# Patient Record
Sex: Female | Born: 1935 | Race: White | Hispanic: No | Smoking: Never smoker
Health system: Southern US, Community
[De-identification: ages and names within clinical notes are randomized; demographics above are authoritative.]

## PROBLEM LIST (undated history)

## (undated) DIAGNOSIS — E785 Hyperlipidemia, unspecified: Secondary | ICD-10-CM

## (undated) DIAGNOSIS — C801 Malignant (primary) neoplasm, unspecified: Secondary | ICD-10-CM

## (undated) DIAGNOSIS — K219 Gastro-esophageal reflux disease without esophagitis: Secondary | ICD-10-CM

## (undated) DIAGNOSIS — I5181 Takotsubo syndrome: Secondary | ICD-10-CM

## (undated) DIAGNOSIS — M1990 Unspecified osteoarthritis, unspecified site: Secondary | ICD-10-CM

## (undated) DIAGNOSIS — E119 Type 2 diabetes mellitus without complications: Secondary | ICD-10-CM

## (undated) DIAGNOSIS — E079 Disorder of thyroid, unspecified: Secondary | ICD-10-CM

## (undated) DIAGNOSIS — I10 Essential (primary) hypertension: Secondary | ICD-10-CM

## (undated) DIAGNOSIS — I219 Acute myocardial infarction, unspecified: Secondary | ICD-10-CM

## (undated) HISTORY — DX: Hyperlipidemia, unspecified: E78.5

## (undated) HISTORY — PX: APPENDECTOMY: SHX54

## (undated) HISTORY — DX: Unspecified osteoarthritis, unspecified site: M19.90

## (undated) HISTORY — PX: THYROID SURGERY: SHX805

## (undated) HISTORY — PX: MASTECTOMY: SHX3

## (undated) HISTORY — DX: Gastro-esophageal reflux disease without esophagitis: K21.9

## (undated) HISTORY — PX: BREAST SURGERY: SHX581

---

## 1998-04-03 ENCOUNTER — Other Ambulatory Visit: Admission: RE | Admit: 1998-04-03 | Discharge: 1998-04-03 | Payer: Self-pay | Admitting: Internal Medicine

## 1999-01-07 ENCOUNTER — Ambulatory Visit (HOSPITAL_COMMUNITY): Admission: RE | Admit: 1999-01-07 | Discharge: 1999-01-07 | Payer: Self-pay | Admitting: Internal Medicine

## 1999-03-27 ENCOUNTER — Other Ambulatory Visit: Admission: RE | Admit: 1999-03-27 | Discharge: 1999-03-27 | Payer: Self-pay | Admitting: *Deleted

## 2000-06-09 ENCOUNTER — Other Ambulatory Visit: Admission: RE | Admit: 2000-06-09 | Discharge: 2000-06-09 | Payer: Self-pay | Admitting: Internal Medicine

## 2000-06-14 ENCOUNTER — Encounter: Payer: Self-pay | Admitting: Internal Medicine

## 2000-06-14 ENCOUNTER — Encounter: Admission: RE | Admit: 2000-06-14 | Discharge: 2000-06-14 | Payer: Self-pay | Admitting: Internal Medicine

## 2000-07-06 ENCOUNTER — Encounter: Payer: Self-pay | Admitting: Physical Medicine and Rehabilitation

## 2000-07-06 ENCOUNTER — Ambulatory Visit
Admission: RE | Admit: 2000-07-06 | Discharge: 2000-07-06 | Payer: Self-pay | Admitting: Physical Medicine and Rehabilitation

## 2000-08-04 ENCOUNTER — Encounter
Admission: RE | Admit: 2000-08-04 | Discharge: 2000-10-05 | Payer: Self-pay | Admitting: Physical Medicine and Rehabilitation

## 2000-12-21 HISTORY — PX: CARDIAC CATHETERIZATION: SHX172

## 2001-06-28 ENCOUNTER — Encounter: Payer: Self-pay | Admitting: Cardiology

## 2001-06-28 ENCOUNTER — Encounter: Admission: RE | Admit: 2001-06-28 | Discharge: 2001-06-28 | Payer: Self-pay | Admitting: Cardiology

## 2001-07-04 ENCOUNTER — Ambulatory Visit (HOSPITAL_COMMUNITY): Admission: RE | Admit: 2001-07-04 | Discharge: 2001-07-04 | Payer: Self-pay | Admitting: Cardiology

## 2002-05-01 ENCOUNTER — Other Ambulatory Visit: Admission: RE | Admit: 2002-05-01 | Discharge: 2002-05-01 | Payer: Self-pay | Admitting: Internal Medicine

## 2002-08-01 ENCOUNTER — Encounter: Payer: Self-pay | Admitting: Orthopedic Surgery

## 2002-08-02 ENCOUNTER — Ambulatory Visit (HOSPITAL_COMMUNITY): Admission: RE | Admit: 2002-08-02 | Discharge: 2002-08-03 | Payer: Self-pay | Admitting: Orthopedic Surgery

## 2002-08-02 ENCOUNTER — Encounter: Payer: Self-pay | Admitting: Orthopedic Surgery

## 2002-08-09 ENCOUNTER — Ambulatory Visit: Admission: RE | Admit: 2002-08-09 | Discharge: 2002-08-09 | Payer: Self-pay | Admitting: Orthopedic Surgery

## 2002-12-01 ENCOUNTER — Encounter: Payer: Self-pay | Admitting: Cardiology

## 2002-12-01 ENCOUNTER — Encounter: Admission: RE | Admit: 2002-12-01 | Discharge: 2002-12-01 | Payer: Self-pay | Admitting: Cardiology

## 2002-12-08 ENCOUNTER — Ambulatory Visit (HOSPITAL_BASED_OUTPATIENT_CLINIC_OR_DEPARTMENT_OTHER): Admission: RE | Admit: 2002-12-08 | Discharge: 2002-12-08 | Payer: Self-pay | Admitting: Orthopedic Surgery

## 2003-10-05 ENCOUNTER — Encounter: Admission: RE | Admit: 2003-10-05 | Discharge: 2003-10-05 | Payer: Self-pay | Admitting: Gastroenterology

## 2003-10-05 ENCOUNTER — Encounter: Payer: Self-pay | Admitting: Gastroenterology

## 2004-05-30 ENCOUNTER — Encounter: Admission: RE | Admit: 2004-05-30 | Discharge: 2004-08-28 | Payer: Self-pay | Admitting: Internal Medicine

## 2004-09-18 ENCOUNTER — Encounter: Admission: RE | Admit: 2004-09-18 | Discharge: 2004-12-17 | Payer: Self-pay | Admitting: Internal Medicine

## 2005-06-26 ENCOUNTER — Ambulatory Visit (HOSPITAL_COMMUNITY): Admission: RE | Admit: 2005-06-26 | Discharge: 2005-06-26 | Payer: Self-pay | Admitting: Internal Medicine

## 2005-08-26 ENCOUNTER — Other Ambulatory Visit: Admission: RE | Admit: 2005-08-26 | Discharge: 2005-08-26 | Payer: Self-pay | Admitting: Internal Medicine

## 2007-09-14 ENCOUNTER — Encounter: Admission: RE | Admit: 2007-09-14 | Discharge: 2007-09-14 | Payer: Self-pay | Admitting: *Deleted

## 2008-12-21 DIAGNOSIS — I5181 Takotsubo syndrome: Secondary | ICD-10-CM

## 2008-12-21 DIAGNOSIS — I219 Acute myocardial infarction, unspecified: Secondary | ICD-10-CM

## 2008-12-21 HISTORY — DX: Acute myocardial infarction, unspecified: I21.9

## 2008-12-21 HISTORY — DX: Takotsubo syndrome: I51.81

## 2009-01-06 ENCOUNTER — Inpatient Hospital Stay (HOSPITAL_COMMUNITY): Admission: EM | Admit: 2009-01-06 | Discharge: 2009-01-10 | Payer: Self-pay | Admitting: Emergency Medicine

## 2009-01-06 ENCOUNTER — Ambulatory Visit: Payer: Self-pay | Admitting: *Deleted

## 2010-05-30 ENCOUNTER — Inpatient Hospital Stay (HOSPITAL_COMMUNITY): Admission: EM | Admit: 2010-05-30 | Discharge: 2010-06-02 | Payer: Self-pay | Admitting: Emergency Medicine

## 2010-12-08 ENCOUNTER — Encounter
Admission: RE | Admit: 2010-12-08 | Discharge: 2010-12-08 | Payer: Self-pay | Source: Home / Self Care | Attending: Cardiology | Admitting: Cardiology

## 2011-01-06 ENCOUNTER — Ambulatory Visit (HOSPITAL_COMMUNITY)
Admission: RE | Admit: 2011-01-06 | Discharge: 2011-01-06 | Payer: Self-pay | Source: Home / Self Care | Attending: Internal Medicine | Admitting: Internal Medicine

## 2011-03-09 LAB — BASIC METABOLIC PANEL
BUN: 19 mg/dL (ref 6–23)
CO2: 31 mEq/L (ref 19–32)
Calcium: 8.8 mg/dL (ref 8.4–10.5)
Calcium: 8.8 mg/dL (ref 8.4–10.5)
Creatinine, Ser: 1.15 mg/dL (ref 0.4–1.2)
GFR calc Af Amer: 55 mL/min — ABNORMAL LOW (ref >60)
GFR calc non Af Amer: >60 mL/min (ref >60)
Glucose, Bld: 106 mg/dL — ABNORMAL HIGH (ref 70–99)

## 2011-03-09 LAB — CBC
Hemoglobin: 12.2 g/dL (ref 12.0–15.0)
MCHC: 33.7 g/dL (ref 30.0–36.0)
MCHC: 33.9 g/dL (ref 30.0–36.0)
Platelets: 161 10*3/uL (ref 150–400)
RBC: 3.9 MIL/uL (ref 3.87–5.11)
RDW: 13.3 % (ref 11.5–15.5)
WBC: 16.3 10*3/uL — ABNORMAL HIGH (ref 4.0–10.5)

## 2011-03-09 LAB — CARDIAC PANEL(CRET KIN+CKTOT+MB+TROPI)
CK, MB: 3.2 ng/mL (ref 0.3–4.0)
CK, MB: 3.6 ng/mL (ref 0.3–4.0)
Relative Index: 3 — ABNORMAL HIGH (ref 0.0–2.5)
Relative Index: INVALID (ref 0.0–2.5)
Total CK: 94 U/L (ref 7–177)
Troponin I: 0.02 ng/mL (ref 0.00–0.06)
Troponin I: 0.02 ng/mL (ref 0.00–0.06)

## 2011-03-09 LAB — COMPREHENSIVE METABOLIC PANEL
Albumin: 3.3 g/dL — ABNORMAL LOW (ref 3.5–5.2)
CO2: 28 mEq/L (ref 19–32)
Chloride: 99 mEq/L (ref 96–112)
Creatinine, Ser: 0.9 mg/dL (ref 0.4–1.2)
GFR calc non Af Amer: 60 mL/min — ABNORMAL LOW (ref >60)
Sodium: 135 mEq/L (ref 135–145)
Total Bilirubin: 0.6 mg/dL (ref 0.3–1.2)
Total Protein: 6.4 g/dL (ref 6.0–8.3)

## 2011-03-09 LAB — URINALYSIS, ROUTINE W REFLEX MICROSCOPIC
Bilirubin Urine: NEGATIVE
Glucose, UA: 250 mg/dL — AB
Hgb urine dipstick: NEGATIVE
Urobilinogen, UA: 0.2 mg/dL (ref 0.0–1.0)

## 2011-03-09 LAB — GLUCOSE, CAPILLARY
Glucose-Capillary: 107 mg/dL — ABNORMAL HIGH (ref 70–99)
Glucose-Capillary: 126 mg/dL — ABNORMAL HIGH (ref 70–99)
Glucose-Capillary: 136 mg/dL — ABNORMAL HIGH (ref 70–99)
Glucose-Capillary: 142 mg/dL — ABNORMAL HIGH (ref 70–99)
Glucose-Capillary: 144 mg/dL — ABNORMAL HIGH (ref 70–99)
Glucose-Capillary: 91 mg/dL (ref 70–99)

## 2011-03-09 LAB — DIFFERENTIAL
Eosinophils Absolute: 0 10*3/uL (ref 0.0–0.7)
Eosinophils Relative: 0 % (ref 0–5)
Lymphocytes Relative: 20 % (ref 12–46)
Monocytes Absolute: 1.4 10*3/uL — ABNORMAL HIGH (ref 0.1–1.0)
Monocytes Relative: 9 % (ref 3–12)
Neutrophils Relative %: 71 % (ref 43–77)

## 2011-03-09 LAB — POCT CARDIAC MARKERS: CKMB, poc: 2 ng/mL (ref 1.0–8.0)

## 2011-03-09 LAB — TSH: TSH: 1.411 u[IU]/mL (ref 0.350–4.500)

## 2011-04-06 LAB — GLUCOSE, CAPILLARY
Glucose-Capillary: 113 mg/dL — ABNORMAL HIGH (ref 70–99)
Glucose-Capillary: 113 mg/dL — ABNORMAL HIGH (ref 70–99)
Glucose-Capillary: 118 mg/dL — ABNORMAL HIGH (ref 70–99)
Glucose-Capillary: 122 mg/dL — ABNORMAL HIGH (ref 70–99)
Glucose-Capillary: 124 mg/dL — ABNORMAL HIGH (ref 70–99)
Glucose-Capillary: 127 mg/dL — ABNORMAL HIGH (ref 70–99)
Glucose-Capillary: 170 mg/dL — ABNORMAL HIGH (ref 70–99)
Glucose-Capillary: 216 mg/dL — ABNORMAL HIGH (ref 70–99)
Glucose-Capillary: 222 mg/dL — ABNORMAL HIGH (ref 70–99)
Glucose-Capillary: 246 mg/dL — ABNORMAL HIGH (ref 70–99)
Glucose-Capillary: 82 mg/dL (ref 70–99)

## 2011-04-06 LAB — BASIC METABOLIC PANEL
BUN: 12 mg/dL (ref 6–23)
Chloride: 107 mEq/L (ref 96–112)
GFR calc Af Amer: >60 mL/min (ref >60)
GFR calc non Af Amer: >60 mL/min (ref >60)
GFR calc non Af Amer: >60 mL/min (ref >60)
Glucose, Bld: 141 mg/dL — ABNORMAL HIGH (ref 70–99)
Potassium: 3.5 mEq/L (ref 3.5–5.1)
Potassium: 3.7 mEq/L (ref 3.5–5.1)
Sodium: 138 mEq/L (ref 135–145)
Sodium: 139 mEq/L (ref 135–145)

## 2011-04-06 LAB — CULTURE, BLOOD (ROUTINE X 2)

## 2011-04-06 LAB — TSH: TSH: 0.986 u[IU]/mL (ref 0.350–4.500)

## 2011-04-06 LAB — SEDIMENTATION RATE: Sed Rate: 14 mm/hr (ref 0–22)

## 2011-04-06 LAB — CBC
HCT: 32.9 % — ABNORMAL LOW (ref 36.0–46.0)
Hemoglobin: 11.2 g/dL — ABNORMAL LOW (ref 12.0–15.0)
MCHC: 34.3 g/dL (ref 30.0–36.0)
MCV: 93.1 fL (ref 78.0–100.0)
MCV: 93.7 fL (ref 78.0–100.0)
Platelets: 154 10*3/uL (ref 150–400)
Platelets: 176 10*3/uL (ref 150–400)
Platelets: 201 10*3/uL (ref 150–400)
RBC: 3.49 MIL/uL — ABNORMAL LOW (ref 3.87–5.11)
RBC: 3.55 MIL/uL — ABNORMAL LOW (ref 3.87–5.11)
RBC: 3.71 MIL/uL — ABNORMAL LOW (ref 3.87–5.11)
WBC: 10.2 10*3/uL (ref 4.0–10.5)
WBC: 10.4 10*3/uL (ref 4.0–10.5)
WBC: 11.6 10*3/uL — ABNORMAL HIGH (ref 4.0–10.5)

## 2011-04-06 LAB — COMPREHENSIVE METABOLIC PANEL
ALT: 9 U/L (ref 0–35)
AST: 19 U/L (ref 0–37)
Albumin: 3.1 g/dL — ABNORMAL LOW (ref 3.5–5.2)
Alkaline Phosphatase: 29 U/L — ABNORMAL LOW (ref 39–117)
CO2: 27 mEq/L (ref 19–32)
Chloride: 104 mEq/L (ref 96–112)
GFR calc Af Amer: >60 mL/min (ref >60)
GFR calc non Af Amer: >60 mL/min (ref >60)
Potassium: 3 mEq/L — ABNORMAL LOW (ref 3.5–5.1)
Sodium: 137 mEq/L (ref 135–145)
Total Bilirubin: 0.7 mg/dL (ref 0.3–1.2)

## 2011-04-06 LAB — LIPID PANEL
Total CHOL/HDL Ratio: 5.3 RATIO
VLDL: 23 mg/dL (ref 0–40)

## 2011-04-06 LAB — DIFFERENTIAL
Basophils Absolute: 0 10*3/uL (ref 0.0–0.1)
Eosinophils Absolute: 0 10*3/uL (ref 0.0–0.7)
Lymphs Abs: 1.2 10*3/uL (ref 0.7–4.0)
Neutrophils Relative %: 88 % — ABNORMAL HIGH (ref 43–77)

## 2011-04-06 LAB — URINE CULTURE

## 2011-04-06 LAB — CARDIAC PANEL(CRET KIN+CKTOT+MB+TROPI)
CK, MB: 6.7 ng/mL — ABNORMAL HIGH (ref 0.3–4.0)
CK, MB: 6.9 ng/mL — ABNORMAL HIGH (ref 0.3–4.0)
CK, MB: 8.3 ng/mL — ABNORMAL HIGH (ref 0.3–4.0)
Relative Index: 5.4 — ABNORMAL HIGH (ref 0.0–2.5)
Relative Index: 5.6 — ABNORMAL HIGH (ref 0.0–2.5)
Total CK: 146 U/L (ref 7–177)
Total CK: 82 U/L (ref 7–177)
Troponin I: 1.2 ng/mL (ref 0.00–0.06)
Troponin I: 1.42 ng/mL (ref 0.00–0.06)
Troponin I: 2.04 ng/mL (ref 0.00–0.06)

## 2011-04-06 LAB — HEPARIN LEVEL (UNFRACTIONATED)
Heparin Unfractionated: 0.35 IU/mL (ref 0.30–0.70)
Heparin Unfractionated: 0.77 IU/mL — ABNORMAL HIGH (ref 0.30–0.70)
Heparin Unfractionated: <0.1 IU/mL — ABNORMAL LOW (ref 0.30–0.70)

## 2011-04-06 LAB — POCT I-STAT, CHEM 8
Creatinine, Ser: 0.8 mg/dL (ref 0.4–1.2)
HCT: 42 % (ref 36.0–46.0)
Hemoglobin: 14.3 g/dL (ref 12.0–15.0)
Potassium: 3.6 mEq/L (ref 3.5–5.1)
Sodium: 133 mEq/L — ABNORMAL LOW (ref 135–145)

## 2011-04-06 LAB — URINALYSIS, ROUTINE W REFLEX MICROSCOPIC
Bilirubin Urine: NEGATIVE
Ketones, ur: 15 mg/dL — AB
Nitrite: NEGATIVE
Urobilinogen, UA: 0.2 mg/dL (ref 0.0–1.0)

## 2011-04-06 LAB — MAGNESIUM: Magnesium: 2.6 mg/dL — ABNORMAL HIGH (ref 1.5–2.5)

## 2011-04-06 LAB — APTT: aPTT: 39 seconds — ABNORMAL HIGH (ref 24–37)

## 2011-04-06 LAB — PROTIME-INR: INR: 1 (ref 0.00–1.49)

## 2011-05-05 NOTE — Discharge Summary (Signed)
Lori Schwartz, Lori Schwartz                ACCOUNT NO.:  [id number]   MEDICAL RECORD NO.:  [id number]          PATIENT TYPE:  INP   LOCATION:  1421                         FACILITY:  [facility]   PHYSICIAN:  Hollice Espy, M.D.DATE OF BIRTH:  1925-07-24   DATE OF ADMISSION:  01/06/2009  DATE OF DISCHARGE:  01/10/2009                               DISCHARGE SUMMARY   PRIMARY CARE PHYSICIAN:  Hal T. Stoneking, M.D.   CARDIOLOGIST:  Thereasa Solo. Little, M.D., [facility].   DISCHARGE DIAGNOSES:  1. Non-Q-wave myocardial infarction.  2. Systolic congestive heart failure decreased ejection fraction of      40%.  No acute exacerbation.  3. CAD, mild.  4. Suspected coronary artery spasm.  5. Hypertension.  6. Fall.  7. Sciatica, now resolved.   DISCHARGE MEDICATIONS:  1. New medicines for this patient:  The patient's Mavik is being      increased from 5 mg up to 8 mg.  2. Skelaxin 100 p.o. t.i.d. p.r.n. for sciatic pain.  3. The patient will continue all her other medications.  These are as      follows:      a.     Neurontin 300 mg p.o. q.h.s.      b.     HCTZ 12.5 p.o. daily.      c.     Metformin 100 p.o. b.i.d.      d.     Metoprolol 50 mg p.o. b.i.d.      e.     Prilosec 20 p.o. daily.      f.     Synthroid 112 mcg p.o. daily.      g.     Mavik being increased from 5 to 8 mg.      h.     Welchol 625 p.o. daily.   DISCHARGE INSTRUCTIONS:  Diet:  Heart-healthy diet.   DISPOSITION:  Improved.   HOSPITAL COURSE:  The patient is an 77 year old white female past  medical history of hypertension who presented after weakness and fall.  She was complaining of severe intractable what she described as hip  pain.  However, hip films were negative and closer history.  She  actually described right buttock pain radiating down her leg.  We  suspected this along with sciatica and this quickly resolved the next  day with some anti-inflammatories.  In addition, the patient on  initial  evaluation in the emergency room was noted to have an EKG noting left  bundle branch block which looked to be possibly new, but there was no  previous EKGs to compare it to.  Three sets of cardiac markers were  ordered, her initial set was negative.  However, troponin on the second  set in the hospital had trended up 0.62.  There was a question of  whether or not this was true cardiac in nature, so a third set was done  and it was found that her troponin at this point was elevated to greater  than 2.0.  The patient's cardiologist, Al Little from [facility] was  consulted.  The patient was evaluated and cardiac  catheterization was recommended.  The patient underwent catheterization  on 01/09/2009.  At that point, it was found that she had some mild CAD,  large down vessel in the circumflex, some mild irregularities on the  LAD.  Her EF was decreased and severely hypokinetic over the left  ventricle with an EF 40%.  No apical filling defect.  We suspected that  perhaps she had coronary spasm, question Tako-Tsubo syndrome.  [facility] are recommending increasing the  patient's Mavik to 8 mg.  Continue on her Lopressor and repeating an  echo in 3 months time to reevaluate her left ventricular function.  She  looks to be doing well.  She was working physical therapy.  Felt to  medically stable to sign off completely, and the patient currently is  pain free in her buttock and leg.  It is recommended to do some  stretching exercises as well as take p.r.n. Zanaflex.  The patient,  otherwise, had an eventful hospital course other than described above.  She had some anxiety prior to her cath requiring some p.r.n. Ativan  which she tolerated well.  Overall disposition improved.  She is being  discharged to home.  She will follow up with her PCP Dr. Merlene Laughter  in a few weeks as scheduled for physical.  She will follow up with her  cardiologist,  Dr. Caprice Kluver, in 2-3 weeks, and her activity will be as  tolerated.      Hollice Espy, M.D.  Electronically Signed     SKK/MEDQ  D:  01/10/2009  T:  01/10/2009  Job:  [id number]   cc:   Thereasa Solo. Little, M.D.  Fax: [phone]   Hal T. Stoneking, M.D.  Fax: [phone]

## 2011-05-05 NOTE — H&P (Signed)
NAMERONEE, RANGANATHAN NO.:  [id number]   MEDICAL RECORD NO.:  [id number]          PATIENT TYPE:  EMS   LOCATION:  ED                           FACILITY:  [facility]   PHYSICIAN:  Lucita Ferrara, MD         DATE OF BIRTH:  [birth year]   DATE OF ADMISSION:  01/06/2009  DATE OF DISCHARGE:                              HISTORY & PHYSICAL   error      Lucita Ferrara, MD  Electronically Signed     RR/MEDQ  D:  01/06/2009  T:  01/06/2009  Job:  [id number]

## 2011-05-05 NOTE — Cardiovascular Report (Signed)
NAMEHIBO, BLASDELL                ACCOUNT NO.:  [id number]   MEDICAL RECORD NO.:  [id number]          PATIENT TYPE:  AMB   LOCATION:  CATH                         FACILITY:  [facility]   PHYSICIAN:  Thereasa Solo. Little, M.D. DATE OF BIRTH:  Dec 20, 1925   DATE OF PROCEDURE:  01/09/2009  DATE OF DISCHARGE:                            CARDIAC CATHETERIZATION   INDICATIONS FOR TEST:  This 77 year old female was admitted to [facility] with hip pain.  As part of her evaluation, she had cardiac markers  that revealed an elevated troponin.  Her troponin peaked at 2.0.  She  has left bundle-branch block on EKG.  She has had no cardiac symptoms,  but with the elevated troponin, it was felt that she needed a cath.  She  was reluctant to initially proceed on with cath, but finally decided to  have this done.  She was transferred from [facility]  __________ to the cath lab where she underwent an elective left heart  catheterization.   After obtaining informed consent, the patient was prepped and draped in  the usual sterile fashion exposing the right groin.  Following local  anesthetic with 1% Xylocaine, the Seldinger technique was employed and a  5-French introducer sheath was placed in the right femoral artery.  Left  and right coronary arteriography and ventriculography in the RAO  projection was performed.   COMPLICATIONS:  None.   EQUIPMENT:  A 5-French Judkins configuration catheter.  The right  coronary artery was nondominant, it came off __________ right coronary  cusp and was never selectively engaged, but was well visualized with  cusp injection.   TOTAL CONTRAST:  160 mL.   RESULTS:  1. Hemodynamic monitoring.  Her central aortic pressure is 178/93.      Her left ventricular pressure was 169/15, and there was no      significant valve gradient at the time of pullback.  2. Ventriculography.  Ventriculography in the RAO projection was      performed at the end of the case.  20  mL of contrast at 12 mL per      second revealed good opacification of the left ventricle.  The      distal septal wall and the distal inferior wall were markedly      hypokinetic to akinetic.  The apex moved only slightly and there      are no filling defect in the apex.  The anterior and posterior      basilar segments were hyperdynamic for an ejection fraction of      approximately 40% with an elevated end-diastolic pressure of 23      (this looks like Takotsubo).   CORONARY ARTERIOGRAPHY:  1. Left main.  Normal.  2. Circumflex.  The circumflex was a large dominant vessel at about 5      mm in diameter.  It gave rise to 3 big OM and a PDA.  3. LAD.  The LAD had mild irregularities proximally.  It extended down      towards the apex of the heart, it gave rise to  a diagonal vessel      and the diagonal was free of disease.  There were no high-grade      stenosis in the LAD.  4. Right coronary artery.  Nondominant vessel.   CONCLUSION:  1. No high-grade coronary artery disease.  2. Distal septum, apical, and distal inferior wall motion      abnormalities with 40% ejection fraction consistent with Takotsubo.   This was usually caused by spasm.  She is already on beta-blockers and  aspirin and she is on Mavik at 4 mg.  I have increased her Mavik to 8 mg  which will help improve with LAD remodeling.   Her pressure today is elevated, but I do not think she had her medicine  this morning.   I plan to see her back in the office and she will need a repeat echo in  about 3 months to reassess her LV function, as a rule, there is  substantial improvement in LV systolic function over the time.           ______________________________  Thereasa Solo Little, M.D.     ABL/MEDQ  D:  01/09/2009  T:  01/10/2009  Job:  [id number]   cc:   [facility]  Catheterization Lab

## 2011-05-05 NOTE — H&P (Signed)
NAMEDWIGHT, BURDO NO.:  [id number]   MEDICAL RECORD NO.:  [id number]          PATIENT TYPE:  EMS   LOCATION:  ED                           FACILITY:  [facility]   PHYSICIAN:  Lucita Ferrara, MD         DATE OF BIRTH:  March 01, 1925   DATE OF ADMISSION:  01/06/2009  DATE OF DISCHARGE:                              HISTORY & PHYSICAL   Patient is an 77 year old who presents with several complaints.  1. The most recent complaint is of vomiting that started today.      Vomiting is nonbilious, nonbloody, not associated with any      abdominal pain, fevers, or chills.  She denies urinary frequency,      urgency, or burning.  Her pain, however, is associated with      temporal relations with status post taking oxycodone which she is      taking for her back pain which is intractable and severe.  Vomiting      started after oxycodone after she became severely nauseated.  2. Patient is complaining of severe back pain which is associated with      radiation to her legs, mostly on the right side.  She said that it      is intractable back pain.  She had decrease in ambulation.  She      denies any fevers or chills.  3. She sustained contusions to her lower extremity on her left foot.      She is currently being worked up by Dr. Madelon Lips as an outpatient.      She saw her about 3 weeks ago.  She denied having head trauma and      she also had x-rays here in the emergency room which were      essentially unchanged and negative, but the reason for calling us      is uncontrolled pain, decrease in ambulation and need for physical      therapy/occupational therapy and potential further workup with an      MRI.  4. In the emergency room, she was found to be severely hyperglycemic.      Supposedly, in the last several weeks, her diabetic control has      been not well and per the emergency room doctor, the patient is      borderline acidotic.   REVIEW OF SYSTEMS:  A 12-point otherwise  negative.  As above, she denies  any fevers, chills, urinary frequency, urgency, burning, chest pain,  shortness of breath.   PAST MEDICAL HISTORY:  1. Hypertension.  2. History of breast cancer.  3. Hypothyroidism.  4. Patient has had a history of displaced lateral malleolus fracture      and she is status post open reduction internal fixation of the      lateral malleolus.   PAST SURGICAL HISTORY:  1. Status post mastectomy.  2. Status post left heart catheterization.   SOCIAL HISTORY:  Patient is a nonsmoker, denies drugs or alcohol.  Has a  supportive family.   ALLERGIES:  Allergic to  CODEINE.   MEDICATIONS AT HOME:  1. Neurontin 300 mg.  2. Hydrochlorothiazide 12.5.  3. Metformin 500.  4. Metoprolol 50.  5. Omeprazole 20 mg.  6. Synthroid 0.112 mg.  7. Trandolapril oral 5 mg.  8. WelChol 625, frequency in doses yet to be determined at this point.   PHYSICAL EXAMINATION:  GENERAL:  Patient is in no acute distress.  Blood  pressure is 135/83, pulse 69, respirations 18, temperature 97.8, pulse  ox 97% on room air.  HEENT:  Normocephalic, atraumatic.  Mucous membranes are dry.  NECK:  Supple.  CARDIOVASCULAR:  S1, S2.  Regular rate and rhythm.  No murmurs, rubs or  clicks.  LUNGS:  Clear to auscultation bilaterally without rales or wheezes.  ABDOMEN:  Soft, not tender, not distended.  Positive bowel sounds.  EXTREMITIES:  No clubbing, cyanosis or edema.  There is tenderness to  palpation over the right hip and right lumbar paraspinal area.  Also  some tenderness in the right flank.   EKG shows left bundle branch block.  There is no prior EKG to compare  to.   LABORATORY DATA:  ESR 14.  Urinalysis is negative for leukocytes,  negative for nitrites.  Her hemoglobin is 14, hematocrit 42.  Sodium  133, potassium 3.6, chloride 96, glucose 241, BUN 17, creatinine 0.8.  White count 11.6, hemoglobin and hematocrit 13.5/40.4, neutrophils 88,  platelets 201.  Patient had an  x-ray of lumbar spine which showed a  disco-osteophytic disease of lumbar spine without any acute findings.  Patient had a complete hip on the right side which showed no evidence of  pelvic or hip fracture.  Sclerosis of the pubic symphysis is likely  degenerative.   ASSESSMENT/PLAN:  An 77 year old with:  1. Severe right-sided hip and lumbosacral spine pain radiating to the      right leg that is intractable and getting severely worse.  2. Nausea and vomiting secondary to narcotic pain medications, likely,      versus uncontrolled blood sugars.  3. Uncontrolled diabetes.  4. Uncontrolled hypertension.  5. Hypothyroidism.  6. History of diabetic neuropathy.   DISCUSSION AND ASSESSMENT AND PLAN:  Patient will be admitted to the  medical telemetry unit.  We will proceed with pain control with IV  Dilaudid.  Intermittently, we will continue with Zofran for nausea.  We  will proceed with physical therapy and occupational therapy  consultation.  We will proceed with an MRI of the lumbosacral spine and  right hip.  For better diabetic control we will proceed with sliding  scale insulin, hemoglobin A1c  which is titrated for optimal control.  We will hold metformin for now.  We check a TSH and continue with her  Synthroid.  Obviously, we will proceed with appropriate consultations  depending on her MRI results.  For her left bundle branch block, it is  likely to be chronic but no old EKGs to compare to.  We will have to get  records from the office and make sure that this is not a new finding.  We will cycle her cardiac enzymes x3 every 8 hours.  We will repeat EKG  and find old chart.  DVT and GI prophylaxis with Lovenox and Protonix.  The rest of the plans will depend on her progress and consultant  recommendations.     Lucita Ferrara, MD  Electronically Signed    RR/MEDQ  D:  01/06/2009  T:  01/06/2009  Job:  [id number]

## 2011-05-08 NOTE — Op Note (Signed)
   NAME:  Lori Schwartz, Lori Schwartz                          ACCOUNT NO.:  [id number]   MEDICAL RECORD NO.:  [id number]                   PATIENT TYPE:  OIB   LOCATION:  3032                                 FACILITY:  [facility]   PHYSICIAN:  Thera Flake., M.D.             DATE OF BIRTH:  1925-11-05   DATE OF PROCEDURE:  08/02/2002  DATE OF DISCHARGE:  08/03/2002                                 OPERATIVE REPORT   PREOPERATIVE DIAGNOSIS:  Displaced lateral malleolus  fracture with  disruption of syndesmosis.   POSTOPERATIVE DIAGNOSIS:  Displaced lateral malleolus fracture with  disruption of syndesmosis.   OPERATION:  1. Open reduction and internal fixation of lateral malleolus.  2. Open reduction and internal fixation syndesmosis, left ankle.   SURGEON:  Dyke Brackett, M.D.   ANESTHESIA:  General with a block.   TOURNIQUET TIME:  Approximately 40 minutes.   PROCEDURE:  After sterile prep and drape, exsanguination of the leg, placing  the tourniquet to 350 mmHg.  Straight skin incision was made over a Weber  type B fracture.  There had been moderate healing given the delay in  treatment noted approximately 2 weeks post injury requiring dissection of  some early callus.  The fracture was reduced after that without difficulty.  A 7-hole plate fixed, all screw holes were used.  Reduced the fibular  fracture nicely.  This corrected the lateral translation of the talus  relative to the medical aspect of the tibia and the medial malleolus.  With  clinical and radiographic evidence of disruption of the syndesmosis,  approximately 3 to 3.5 cm above the tip of the talus a 4.5 cortical screw  was placed to reduce the syndesmosis.  This reduced the medially joint space  nicely with probable all four cortices engaged.  The wound was irrigated,  closed with 2-0 Vicryl and skin clips.  Marcaine without epinephrine,  lightly compressive sterile dressing placed and posterior plaster splint   applied.                                               Thera Flake., M.D.    WDC/MEDQ  D:  08/02/2002  T:  08/04/2002  Job:  [id number]

## 2011-05-08 NOTE — Cardiovascular Report (Signed)
[place]  Patient:    Lori Schwartz, Lori Schwartz                       MRN: [id number] Proc. Date: 07/04/01 Adm. Date:  [id number] Attending:  Loreli Dollar CC:         Darius Bump, M.D.  Cardiac Catheterization Laboratory   Cardiac Catheterization  INDICATIONS FOR PROCEDURE:  The patient is a 77 year old female, who has had chest pain radiating into her back and down her left arm associated with activity and associated with shortness of breath.  She is brought in for outpatient cardiac catheterization for what sounded like anginal pain.  PROCEDURES: 1. Left heart catheterization. 2. Selective right and left coronary arteriography. 3. Ventriculography in the right anterior oblique projection. 4. Distal aortogram at the level of renal artery.  EQUIPMENT:  A 6 French Judkins configuration catheter.  COMPLICATIONS:  None.  DESCRIPTION OF PROCEDURE:  The patient was prepped and draped in the usual sterile fashion exposing the right groin.  Following local anesthetic with 1% Xylocaine, the Seldinger technique was employed and a 6 [place] introducer sheath was placed into the right femoral artery.  Selective right and left coronary arteriography and ventriculography in the RAO projection and distal aortogram was performed.  RESULTS: 1. Hemodynamic monitoring:  Central aortic pressure was 184/89, left    ventricular pressure was 184/17 with no aortic valve gradient noted at    the time of pullback. 2. Ventriculography:  Ventriculography in the RAO projection using 20 cc of    contrast at 12 cc/sec. revealed good opacification of the left ventricle.    There was normal left ventricular systolic function with an ejection    fraction greater than 55%.  The end-diastolic pressure was 16.  No    mitral regurgitation was seen.  CORONARY ARTERIOGRAPHY: 1. Left main:  Normal. 2. LAD.  The LAD extended down towards the apex of the heart but did  not    cross the apex.  It gave rise to two diagonal branches.  This system    had only minimal irregularities. 3. Circumflex:  The circumflex was a large dominant vessel giving rise to    three OMs and a large PDA.  The midportion of the circumflex had a    dilated segment between OM-1 and OM-2.  There was no evidence of any    reduction in flow and then there was no evidence of any luminal    irregularities within this dilated segment.  The OMs themselves were    free of disease and the PDA was also free of disease. 4. Right coronary artery:  The right coronary artery is a small nondominant    vessel free of disease.  DISTAL AORTOGRAM:  At the level of the renals using 25 cc at 15 cc/sec. revealed smooth aorta with no aneurysm and no evidence of renal artery stenosis.  CONCLUSIONS: 1. Dilated mid segment of the ongoing circumflex. 2. Normal left ventricular systolic function. 3. No abdominal aortic aneurysm. 4. No renal artery stenosis.  DISCUSSION:  In review of Dr. Delia Chimes note from 1992, her circumflex at that time was dilated and very similar to what was demonstrated at the time of her catheterization today.  Review of cut films from September 06, 1991, shows no significant change in her coronary anatomy. DD:  07/04/01 TD:  07/04/01 Job: [id number]

## 2011-05-08 NOTE — Op Note (Signed)
   NAME:  Lori Schwartz, Lori Schwartz                          ACCOUNT NO.:  [id number]   MEDICAL RECORD NO.:  [id number]                   PATIENT TYPE:  AMB   LOCATION:  DSC                                  FACILITY:  [facility]   PHYSICIAN:  Thera Flake., M.D.             DATE OF BIRTH:  1925-12-01   DATE OF PROCEDURE:  12/08/2002  DATE OF DISCHARGE:                                 OPERATIVE REPORT   INDICATIONS FOR PROCEDURE:  77 year old status post ORIF of ankle, retained  hardware, syndesmotic screw, for removal.   PREOPERATIVE DIAGNOSES:  Retained hardware, left ankle.   POSTOPERATIVE DIAGNOSES:  Retained hardware, left ankle.   OPERATION:  Removal of syndesmosis screw.   SURGEON:  Dyke Brackett, M.D.   ANESTHESIA:  MAC/local with 5 cc of Marcaine and Xylocaine with epinephrine.   PROCEDURE:  Sedation. Area in question was infiltrated over the lateral  incision.  Small portion of lateral incision removed.  Deep screw on the  lateral side of the ankle removed, 4.5 mm screw, confirmed with C-arm  fluoroscopy and removed without complications.  Irrigation.  Closure with 4-  0 nylon.  A lightly compressive sterile dressing applied with an Ace wrap,  figure-of-eight.  Taken to the recovery room in stable condition.                                               Thera Flake., M.D.    WDC/MEDQ  D:  12/08/2002  T:  12/09/2002  Job:  [id number]

## 2011-06-08 ENCOUNTER — Other Ambulatory Visit (HOSPITAL_COMMUNITY)
Admission: RE | Admit: 2011-06-08 | Discharge: 2011-06-08 | Disposition: A | Discharge status: Home / Self Care | Payer: Medicare Other | Source: Ambulatory Visit | Attending: Geriatric Medicine | Admitting: Geriatric Medicine

## 2011-06-08 ENCOUNTER — Other Ambulatory Visit: Payer: Self-pay | Admitting: Geriatric Medicine

## 2011-06-08 DIAGNOSIS — Z124 Encounter for screening for malignant neoplasm of cervix: Secondary | ICD-10-CM | POA: Insufficient documentation

## 2011-06-08 DIAGNOSIS — Z1159 Encounter for screening for other viral diseases: Secondary | ICD-10-CM | POA: Insufficient documentation

## 2012-12-23 ENCOUNTER — Emergency Department (HOSPITAL_COMMUNITY)
Admission: EM | Admit: 2012-12-23 | Discharge: 2012-12-23 | Disposition: A | Discharge status: Home / Self Care | Payer: Medicare Other | Attending: Emergency Medicine | Admitting: Emergency Medicine

## 2012-12-23 ENCOUNTER — Encounter (HOSPITAL_COMMUNITY): Payer: Self-pay | Admitting: Emergency Medicine

## 2012-12-23 ENCOUNTER — Emergency Department (HOSPITAL_COMMUNITY): Payer: Medicare Other

## 2012-12-23 DIAGNOSIS — S82899A Other fracture of unspecified lower leg, initial encounter for closed fracture: Secondary | ICD-10-CM | POA: Insufficient documentation

## 2012-12-23 DIAGNOSIS — I252 Old myocardial infarction: Secondary | ICD-10-CM | POA: Insufficient documentation

## 2012-12-23 DIAGNOSIS — S93609A Unspecified sprain of unspecified foot, initial encounter: Secondary | ICD-10-CM | POA: Insufficient documentation

## 2012-12-23 DIAGNOSIS — E119 Type 2 diabetes mellitus without complications: Secondary | ICD-10-CM | POA: Insufficient documentation

## 2012-12-23 DIAGNOSIS — Y92009 Unspecified place in unspecified non-institutional (private) residence as the place of occurrence of the external cause: Secondary | ICD-10-CM | POA: Insufficient documentation

## 2012-12-23 DIAGNOSIS — Z8639 Personal history of other endocrine, nutritional and metabolic disease: Secondary | ICD-10-CM | POA: Insufficient documentation

## 2012-12-23 DIAGNOSIS — I998 Other disorder of circulatory system: Secondary | ICD-10-CM | POA: Insufficient documentation

## 2012-12-23 DIAGNOSIS — Y939 Activity, unspecified: Secondary | ICD-10-CM | POA: Insufficient documentation

## 2012-12-23 DIAGNOSIS — X500XXA Overexertion from strenuous movement or load, initial encounter: Secondary | ICD-10-CM | POA: Insufficient documentation

## 2012-12-23 DIAGNOSIS — I10 Essential (primary) hypertension: Secondary | ICD-10-CM | POA: Insufficient documentation

## 2012-12-23 DIAGNOSIS — S93601A Unspecified sprain of right foot, initial encounter: Secondary | ICD-10-CM

## 2012-12-23 DIAGNOSIS — Z853 Personal history of malignant neoplasm of breast: Secondary | ICD-10-CM | POA: Insufficient documentation

## 2012-12-23 DIAGNOSIS — S82839A Other fracture of upper and lower end of unspecified fibula, initial encounter for closed fracture: Secondary | ICD-10-CM

## 2012-12-23 DIAGNOSIS — Z862 Personal history of diseases of the blood and blood-forming organs and certain disorders involving the immune mechanism: Secondary | ICD-10-CM | POA: Insufficient documentation

## 2012-12-23 HISTORY — DX: Essential (primary) hypertension: I10

## 2012-12-23 HISTORY — DX: Disorder of thyroid, unspecified: E07.9

## 2012-12-23 HISTORY — DX: Acute myocardial infarction, unspecified: I21.9

## 2012-12-23 HISTORY — DX: Type 2 diabetes mellitus without complications: E11.9

## 2012-12-23 HISTORY — DX: Malignant (primary) neoplasm, unspecified: C80.1

## 2012-12-23 MED ORDER — HYDROCODONE-ACETAMINOPHEN 5-325 MG PO TABS: 0.5000 | ORAL_TABLET | ORAL | Status: DC | PRN

## 2012-12-23 MED ORDER — HYDROCODONE-ACETAMINOPHEN 5-325 MG PO TABS
1.0000 | ORAL_TABLET | Freq: Once | ORAL | Status: AC
  Administered 2012-12-23: 1 via ORAL
  Filled 2012-12-23: qty 1

## 2012-12-23 NOTE — ED Provider Notes (Addendum)
History     CSN: [id number]  Arrival date & time 12/23/12  4696   First MD Initiated Contact with Patient 12/23/12 0330      Chief Complaint  Patient presents with  . Foot Injury    (Consider location/radiation/quality/duration/timing/severity/associated sxs/prior treatment) HPI This is an 79 year old female who got up to go the bathroom this morning and fell, injuring her right foot. She states the whole weight of her body came down on that foot. She is now having moderate pain, swelling and ecchymosis to the dorsal right foot. She is not having pain in that ankle. She denies neck pain or other injury. Pain is worse with palpation or attempted ambulation. Her right foot was splinted with a pillow by EMS prior to arrival.  Past Medical History  Diagnosis Date  . Diabetes mellitus without complication   . Hypertension   . Thyroid disease   . Cancer     breast  . Acute MI 2011?    Past Surgical History  Procedure Date  . Breast surgery     L mastectomy   . Thyroid surgery   . Appendectomy   . Mastectomy     left    History reviewed. No pertinent family history.  History  Substance Use Topics  . Smoking status: Never Smoker   . Smokeless tobacco: Not on file  . Alcohol Use: No    OB History    Grav Para Term Preterm Abortions TAB SAB Ect Mult Living                  Review of Systems  All other systems reviewed and are negative.    Allergies  Codeine  Home Medications  No current outpatient prescriptions on file.  BP 184/84  Pulse 74  Temp 98.1 F (36.7 C) (Oral)  Resp 18  Ht 5\' 5"  (1.651 m)  Wt 155 lb (70.308 kg)  BMI 25.79 kg/m2  SpO2 98%  Physical Exam General: Well-developed, well-nourished female in no acute distress; appears younger than age of record HENT: normocephalic, atraumatic Eyes: pupils equal round and reactive to light; extraocular muscles intact Neck: supple Heart: regular rate and rhythm Lungs: Normal respiratory effort and  excursion Abdomen: soft; nondistended Extremities: Swelling, ecchymosis and tenderness of dorsal right foot; pulses normal; right foot distally neurovascularly intact Neurologic: Awake, alert and oriented; motor function intact in all extremities and symmetric; no facial droop Skin: Warm and dry Psychiatric: Normal mood and affect    ED Course  Procedures (including critical care time)     MDM  Nursing notes and vitals signs, including pulse oximetry, reviewed.  Summary of this visit's results, reviewed by myself:  Imaging Studies: Dg Ankle Complete Right  12/23/2012  *RADIOLOGY REPORT*  Clinical Data: Status post fall; lateral right ankle swelling.  RIGHT ANKLE - COMPLETE 3+ VIEW  Comparison: Right ankle radiographs performed 05/31/2010  Findings: There appears to be a mildly displaced fracture involving the distal aspect of the fibula.  No definite ankle mortise widening is seen.  The interosseous space remains grossly intact. Overlying soft tissue swelling is noted at the lateral malleolus. No talar tilt or subluxation is seen.  There is diffuse osteopenia of visualized osseous structures.  The joint spaces are preserved.  Mild diffuse soft tissue swelling is noted.  IMPRESSION:  1.  Mildly displaced fracture involving the distal aspect of the fibula. 2.  Diffuse osteopenia of visualized osseous structures.   Original Report Authenticated By: Tonia Ghent, M.D.  Dg Foot Complete Right  12/23/2012  *RADIOLOGY REPORT*  Clinical Data: Status post fall; right foot bruising.  RIGHT FOOT COMPLETE - 3+ VIEW  Comparison: Right foot radiographs performed 05/31/2010  Findings: The mildly displaced fracture involving the distal aspect of the fibula is better characterized on concurrent ankle radiographs, with overlying soft tissue swelling.  There is suggestion of a minimally displaced fracture involving the lateral plantar aspect of the base of the first proximal phalanx, with intra-articular  extension.  No additional fractures are seen. Visualized joint spaces are otherwise intact.  There is diffuse osteopenia of visualized osseous structures. Diffuse soft tissue swelling is noted about the foot.  IMPRESSION:  1.  Mildly displaced fracture involving the distal aspect of the fibula, better characterized on concurrent ankle radiographs. 2.  Suggestion of minimally displaced fracture involving the lateral plantar aspect of the base of the first proximal phalanx, with intra-articular extension. 3.  Diffuse osteopenia of visualized osseous structures.   Original Report Authenticated By: Tonia Ghent, M.D.    4:33 AM There is now increased swelling over the right lateral malleolus consistent with a right distal fibula fracture. The patient thinks she stubbed her toe several days ago and this could explain the suspected fracture seen by the radiologist. The location of the swelling and ecchymosis of her right foot does not correlate with a fracture of the great toe and more likely represents a sprain of the right foot.  We'll splint and referred to her orthopedist, Dr. Otelia Sergeant. Her family states she has a wheelchair that she can use. She had a prior, more severe ankle fracture in the past.         Hanley Seamen, MD 12/23/12 0434  Hanley Seamen, MD 12/23/12 [phone]

## 2012-12-23 NOTE — ED Notes (Signed)
Patient states she stood up to go to the bathroom and "just fell down". Patient rates pain in her R foot 6/10. Bruising to top of R foot, swelling also noted. Patient denies LOC or hitting head, patient states she is not hurting anywhere else.

## 2012-12-23 NOTE — ED Notes (Signed)
Patient R foot elevated and ice pack applied.

## 2012-12-23 NOTE — ED Notes (Signed)
GNF:[id number]<YQ> Expected date:12/23/12<BR> Expected time: 3:09 AM<BR> Means of arrival:Ambulance<BR> Comments:<BR> Ankle injury; fall

## 2012-12-23 NOTE — ED Notes (Addendum)
Per PTAR, patient from home states she fell getting up to go to bathroom. Patient states she twisted her ankle. PTAR reports deformity to R ankle. Patient denies hitting her head, loc, or any additional injuries. Patient reports having broken that ankle approx 2 years ago. Patient diabetic, HTN, thyroid dx. Allergic to codeine.

## 2013-07-04 ENCOUNTER — Other Ambulatory Visit: Payer: Self-pay | Admitting: Geriatric Medicine

## 2013-07-04 DIAGNOSIS — M545 Low back pain: Secondary | ICD-10-CM

## 2013-07-06 ENCOUNTER — Other Ambulatory Visit: Payer: 59

## 2013-07-08 ENCOUNTER — Ambulatory Visit
Admission: RE | Admit: 2013-07-08 | Discharge: 2013-07-08 | Disposition: A | Discharge status: Home / Self Care | Payer: Medicare Other | Source: Ambulatory Visit | Attending: Geriatric Medicine | Admitting: Geriatric Medicine

## 2013-07-08 DIAGNOSIS — M545 Low back pain: Secondary | ICD-10-CM

## 2014-10-03 ENCOUNTER — Other Ambulatory Visit: Payer: Self-pay | Admitting: Geriatric Medicine

## 2014-10-03 ENCOUNTER — Ambulatory Visit
Admission: RE | Admit: 2014-10-03 | Discharge: 2014-10-03 | Disposition: A | Discharge status: Home / Self Care | Payer: Medicare Other | Source: Ambulatory Visit | Attending: Geriatric Medicine | Admitting: Geriatric Medicine

## 2014-10-03 DIAGNOSIS — R0609 Other forms of dyspnea: Principal | ICD-10-CM

## 2014-10-31 ENCOUNTER — Encounter: Payer: Medicare Other | Admitting: *Deleted

## 2014-11-20 ENCOUNTER — Encounter: Payer: Self-pay | Admitting: *Deleted

## 2014-11-20 ENCOUNTER — Encounter: Payer: Medicare Other | Attending: Geriatric Medicine | Admitting: *Deleted

## 2014-11-20 VITALS — Ht 64.0 in | Wt 143.5 lb

## 2014-11-20 DIAGNOSIS — E119 Type 2 diabetes mellitus without complications: Secondary | ICD-10-CM

## 2014-11-20 DIAGNOSIS — Z713 Dietary counseling and surveillance: Secondary | ICD-10-CM | POA: Insufficient documentation

## 2014-11-20 NOTE — Patient Instructions (Addendum)
Plan:  Aim for 2-3 Carb Choices per meal (30-45 grams) +/- 1 either way  Aim for 0-15 Carbs per snack if hungry  Include protein in moderation with your meals and snacks Consider reading food labels for Total Carbohydrate and Fat Grams of foods Continue checking BG per day as directed by MD   Insulin Administration: Take off cap to pen Put on needle Clean skin Dial insulin up to 6 Remove needle covers  Inject  Push plunger Count to #10 Remove needle and put in red plastic container Make a check on the calender for each insulin injection  Contact Dr. Carlyle Lipa  Office for order for more pen needles  [place] Yogurt has greater amount of protein ALWAYS HAVE PROTEIN

## 2014-11-20 NOTE — Progress Notes (Signed)
Diabetes Self-Management Education  Visit Type:  Insulin Start and Diabetes Diet  Appt. Start Time: 1500 Appt. End Time: 1630  11/20/2014  Lori Schwartz, identified by name and date of birth, is a 80 y.o. female with a diagnosis of Diabetes: Type 2.  Other people present during visit:  Family Member (Daughter)   ASSESSMENT  Height 5\' 4"  (1.626 m), weight 143 lb 8 oz (65.091 kg). Body mass index is 24.62 kg/(m^2).  Initial Visit Information:  Are you currently following a meal plan?: No (is aware of what she eats.)   Are you taking your medications as prescribed?: Yes Are you checking your feet?: Yes How many days per week are you checking your feet?: 4 How often do you need to have someone help you when you read instructions, pamphlets, or other written materials from your doctor or pharmacy?: 1 - Never   Psychosocial:   Patient Belief/Attitude about Diabetes: Motivated to manage diabetes Self-care barriers: Hard of hearing, Impaired vision, Unsteady gait/risk for falls Self-management support: Doctor's office, CDE visits, Family Other persons present: Family Member (Daughter) Patient Concerns: Glycemic Control, Other (comment) (Starting insulin) Preferred Learning Style: No preference indicated Learning Readiness: Ready  Complications:   Last HgB A1C per patient/outside source: 7.2 mg/dL How often do you check your blood sugar?: 1-2 times/day Fasting Blood glucose range (mg/dL): 180-200 Number of hypoglycemic episodes per month: 0 Have you had a dilated eye exam in the past 12 months?: Yes Have you had a dental exam in the past 12 months?: Yes  Diet Intake:  Breakfast: banana, cheese, coffee splenda Lunch: banana, peanut butter, crackers  / 1/2 pimento cheese sandwich, chips  Exercise:  Exercise: ADL's (Going to physical therapy at present to increase ability to ambulate due to prior ortho surgery)  Individualized Plan for Diabetes Self-Management Training:    Learning Objective:  Patient will have a greater understanding of diabetes self-management.  Patient education plan per assessed needs and concerns is to attend individual sessions for     Education Topics Reviewed with Patient Today:  Explored patient's options for treatment of their diabetes Role of diet in the treatment of diabetes and the relationship between the three main macronutrients and blood glucose level, Food label reading, portion sizes and measuring food., Carbohydrate counting, Meal options for control of blood glucose level and chronic complications.   Taught/reviewed insulin injection, site rotation, insulin storage and needle disposal., Reviewed patients medication for diabetes, action, purpose, timing of dose and side effects. Daily foot exams, Yearly dilated eye exam Taught treatment of hypoglycemia - the 15 rule. Relationship between chronic complications and blood glucose control, Assessed and discussed foot care and prevention of foot problems, Dental care, Lipid levels, blood glucose control and heart disease, Reviewed with patient heart disease, higher risk of, and prevention     PATIENTS GOALS/Plan (Developed by the patient):  Nutrition: General guidelines for healthy choices and portions discussed Medications: take my medication as prescribed Monitoring : test my blood glucose as discussed (note x per day with comment) Reducing Risk: do foot checks daily, treat hypoglycemia with 15 grams of carbs if blood glucose less than 70mg /dL Health Coping: ask for help with (comment) (Accept assistance from Daughter)  Patient Instructions  Plan:  Aim for 2-3 Carb Choices per meal (30-45 grams) +/- 1 either way  Aim for 0-15 Carbs per snack if hungry  Include protein in moderation with your meals and snacks Consider reading food labels for Total Carbohydrate and Fat Grams  of foods Continue checking BG per day as directed by MD   Insulin Administration: Take off  cap Put on needle Clean skin Dial up to 6 Remove needle covers  Inject  Push plunger Count to #10 Remove needle and put in red plastic container  [place] Yogurt ALWAYS HAVE PROTEIN  Make a check on the calender for each insulin injection You have given your insulin for today. Your next insulin injection will be tomorrow evening.  Expected Outcomes:  Demonstrated interest in learning. Expect positive outcomes  Education material provided: Living Well with Diabetes, Meal plan card and My Plate  If problems or questions, patient to contact team via:  Phone  Future DSME appointment: PRN

## 2014-12-04 ENCOUNTER — Ambulatory Visit: Payer: Medicare Other | Admitting: *Deleted

## 2015-01-07 ENCOUNTER — Other Ambulatory Visit: Payer: Self-pay | Admitting: Internal Medicine

## 2015-01-07 ENCOUNTER — Ambulatory Visit
Admission: RE | Admit: 2015-01-07 | Discharge: 2015-01-07 | Disposition: A | Discharge status: Home / Self Care | Payer: Medicare Other | Source: Ambulatory Visit | Attending: Internal Medicine | Admitting: Internal Medicine

## 2015-01-07 DIAGNOSIS — R05 Cough: Secondary | ICD-10-CM

## 2015-01-07 DIAGNOSIS — R059 Cough, unspecified: Secondary | ICD-10-CM

## 2015-05-06 ENCOUNTER — Ambulatory Visit
Admission: RE | Admit: 2015-05-06 | Discharge: 2015-05-06 | Disposition: A | Discharge status: Home / Self Care | Payer: Medicare Other | Source: Ambulatory Visit | Attending: Nurse Practitioner | Admitting: Nurse Practitioner

## 2015-05-06 ENCOUNTER — Other Ambulatory Visit: Payer: Self-pay | Admitting: Nurse Practitioner

## 2015-05-06 DIAGNOSIS — R0602 Shortness of breath: Secondary | ICD-10-CM

## 2015-05-18 ENCOUNTER — Emergency Department (HOSPITAL_COMMUNITY): Payer: Medicare Other

## 2015-05-18 ENCOUNTER — Encounter (HOSPITAL_COMMUNITY): Payer: Self-pay | Admitting: Emergency Medicine

## 2015-05-18 ENCOUNTER — Inpatient Hospital Stay (HOSPITAL_COMMUNITY)
Admission: EM | Admit: 2015-05-18 | Discharge: 2015-05-24 | DRG: 871 | Disposition: A | Discharge status: Home / Self Care | Payer: Medicare Other | Attending: Internal Medicine | Admitting: Internal Medicine

## 2015-05-18 DIAGNOSIS — J189 Pneumonia, unspecified organism: Secondary | ICD-10-CM | POA: Diagnosis not present

## 2015-05-18 DIAGNOSIS — M1990 Unspecified osteoarthritis, unspecified site: Secondary | ICD-10-CM | POA: Diagnosis present

## 2015-05-18 DIAGNOSIS — Z794 Long term (current) use of insulin: Secondary | ICD-10-CM

## 2015-05-18 DIAGNOSIS — J9601 Acute respiratory failure with hypoxia: Secondary | ICD-10-CM | POA: Diagnosis not present

## 2015-05-18 DIAGNOSIS — A419 Sepsis, unspecified organism: Secondary | ICD-10-CM | POA: Diagnosis not present

## 2015-05-18 DIAGNOSIS — Z7982 Long term (current) use of aspirin: Secondary | ICD-10-CM

## 2015-05-18 DIAGNOSIS — Z9981 Dependence on supplemental oxygen: Secondary | ICD-10-CM | POA: Diagnosis not present

## 2015-05-18 DIAGNOSIS — I878 Other specified disorders of veins: Secondary | ICD-10-CM | POA: Diagnosis present

## 2015-05-18 DIAGNOSIS — E785 Hyperlipidemia, unspecified: Secondary | ICD-10-CM | POA: Diagnosis present

## 2015-05-18 DIAGNOSIS — Z886 Allergy status to analgesic agent status: Secondary | ICD-10-CM | POA: Diagnosis not present

## 2015-05-18 DIAGNOSIS — I10 Essential (primary) hypertension: Secondary | ICD-10-CM | POA: Diagnosis present

## 2015-05-18 DIAGNOSIS — D72829 Elevated white blood cell count, unspecified: Secondary | ICD-10-CM | POA: Diagnosis not present

## 2015-05-18 DIAGNOSIS — K219 Gastro-esophageal reflux disease without esophagitis: Secondary | ICD-10-CM | POA: Diagnosis present

## 2015-05-18 DIAGNOSIS — K5900 Constipation, unspecified: Secondary | ICD-10-CM | POA: Diagnosis present

## 2015-05-18 DIAGNOSIS — R0602 Shortness of breath: Secondary | ICD-10-CM | POA: Diagnosis present

## 2015-05-18 DIAGNOSIS — Z853 Personal history of malignant neoplasm of breast: Secondary | ICD-10-CM | POA: Diagnosis not present

## 2015-05-18 DIAGNOSIS — E119 Type 2 diabetes mellitus without complications: Secondary | ICD-10-CM | POA: Diagnosis not present

## 2015-05-18 DIAGNOSIS — Z91040 Latex allergy status: Secondary | ICD-10-CM | POA: Diagnosis not present

## 2015-05-18 DIAGNOSIS — R0600 Dyspnea, unspecified: Secondary | ICD-10-CM | POA: Diagnosis not present

## 2015-05-18 DIAGNOSIS — K5909 Other constipation: Secondary | ICD-10-CM | POA: Diagnosis not present

## 2015-05-18 DIAGNOSIS — Z9012 Acquired absence of left breast and nipple: Secondary | ICD-10-CM | POA: Diagnosis present

## 2015-05-18 LAB — GLUCOSE, CAPILLARY: Glucose-Capillary: 260 mg/dL — ABNORMAL HIGH (ref 65–99)

## 2015-05-18 LAB — I-STAT TROPONIN, ED: TROPONIN I, POC: 0 ng/mL (ref 0.00–0.08)

## 2015-05-18 LAB — I-STAT CG4 LACTIC ACID, ED: LACTIC ACID, VENOUS: 1.02 mmol/L (ref 0.5–2.0)

## 2015-05-18 LAB — CBC
HCT: 43.8 % (ref 36.0–46.0)
Hemoglobin: 14.8 g/dL (ref 12.0–15.0)
MCH: 31 pg (ref 26.0–34.0)
MCHC: 33.8 g/dL (ref 30.0–36.0)
MCV: 91.6 fL (ref 78.0–100.0)
Platelets: 227 10*3/uL (ref 150–400)
RBC: 4.78 MIL/uL (ref 3.87–5.11)
RDW: 13.5 % (ref 11.5–15.5)
WBC: 12.7 10*3/uL — AB (ref 4.0–10.5)

## 2015-05-18 LAB — BASIC METABOLIC PANEL
ANION GAP: 11 (ref 5–15)
BUN: 20 mg/dL (ref 6–20)
CALCIUM: 9.1 mg/dL (ref 8.9–10.3)
CHLORIDE: 92 mmol/L — AB (ref 101–111)
CO2: 32 mmol/L (ref 22–32)
CREATININE: 0.44 mg/dL (ref 0.44–1.00)
GFR calc Af Amer: >60 mL/min (ref >60)
Glucose, Bld: 122 mg/dL — ABNORMAL HIGH (ref 65–99)
Potassium: 4 mmol/L (ref 3.5–5.1)
Sodium: 135 mmol/L (ref 135–145)

## 2015-05-18 LAB — BRAIN NATRIURETIC PEPTIDE: B NATRIURETIC PEPTIDE 5: 34.1 pg/mL (ref 0.0–100.0)

## 2015-05-18 MED ORDER — ACETAMINOPHEN 325 MG PO TABS
650.0000 mg | ORAL_TABLET | Freq: Four times a day (QID) | ORAL | Status: DC | PRN
  Administered 2015-05-19 – 2015-05-23 (×8): 650 mg via ORAL
  Filled 2015-05-18 (×8): qty 2

## 2015-05-18 MED ORDER — ENOXAPARIN SODIUM 40 MG/0.4ML ~~LOC~~ SOLN
40.0000 mg | SUBCUTANEOUS | Status: DC
  Administered 2015-05-18 – 2015-05-23 (×6): 40 mg via SUBCUTANEOUS
  Filled 2015-05-18 (×8): qty 0.4

## 2015-05-18 MED ORDER — ENSURE ENLIVE PO LIQD
237.0000 mL | Freq: Two times a day (BID) | ORAL | Status: DC
  Administered 2015-05-19: 237 mL via ORAL

## 2015-05-18 MED ORDER — TRANDOLAPRIL 4 MG PO TABS
8.0000 mg | ORAL_TABLET | Freq: Every day | ORAL | Status: DC
  Administered 2015-05-19 – 2015-05-24 (×6): 8 mg via ORAL
  Filled 2015-05-18 (×6): qty 2

## 2015-05-18 MED ORDER — GUAIFENESIN-DM 100-10 MG/5ML PO SYRP
5.0000 mL | ORAL_SOLUTION | ORAL | Status: DC | PRN
  Administered 2015-05-19 – 2015-05-22 (×4): 5 mL via ORAL
  Filled 2015-05-18 (×4): qty 10

## 2015-05-18 MED ORDER — METOPROLOL SUCCINATE ER 50 MG PO TB24
50.0000 mg | ORAL_TABLET | Freq: Two times a day (BID) | ORAL | Status: DC
  Administered 2015-05-18 – 2015-05-24 (×12): 50 mg via ORAL
  Filled 2015-05-18 (×14): qty 1

## 2015-05-18 MED ORDER — DEXTROSE 5 % IV SOLN
1.0000 g | Freq: Once | INTRAVENOUS | Status: AC
  Administered 2015-05-18: 1 g via INTRAVENOUS
  Filled 2015-05-18: qty 10

## 2015-05-18 MED ORDER — LINAGLIPTIN 5 MG PO TABS
5.0000 mg | ORAL_TABLET | Freq: Every day | ORAL | Status: DC
  Administered 2015-05-19 – 2015-05-24 (×6): 5 mg via ORAL
  Filled 2015-05-18 (×6): qty 1

## 2015-05-18 MED ORDER — GABAPENTIN 100 MG PO CAPS
100.0000 mg | ORAL_CAPSULE | Freq: Every day | ORAL | Status: DC
  Administered 2015-05-18 – 2015-05-23 (×6): 100 mg via ORAL
  Filled 2015-05-18 (×8): qty 1

## 2015-05-18 MED ORDER — SENNA 8.6 MG PO TABS
1.0000 | ORAL_TABLET | Freq: Every day | ORAL | Status: DC | PRN
  Administered 2015-05-19: 8.6 mg via ORAL
  Filled 2015-05-18 (×2): qty 1

## 2015-05-18 MED ORDER — INSULIN ASPART 100 UNIT/ML ~~LOC~~ SOLN
0.0000 [IU] | Freq: Three times a day (TID) | SUBCUTANEOUS | Status: DC
  Administered 2015-05-18: 5 [IU] via SUBCUTANEOUS
  Administered 2015-05-19 (×2): 3 [IU] via SUBCUTANEOUS
  Administered 2015-05-19: 2 [IU] via SUBCUTANEOUS
  Administered 2015-05-20: 3 [IU] via SUBCUTANEOUS
  Administered 2015-05-21: 2 [IU] via SUBCUTANEOUS
  Administered 2015-05-21: 3 [IU] via SUBCUTANEOUS
  Administered 2015-05-22: 2 [IU] via SUBCUTANEOUS
  Administered 2015-05-23: 3 [IU] via SUBCUTANEOUS
  Administered 2015-05-23 – 2015-05-24 (×2): 1 [IU] via SUBCUTANEOUS

## 2015-05-18 MED ORDER — VANCOMYCIN HCL 500 MG IV SOLR
500.0000 mg | Freq: Two times a day (BID) | INTRAVENOUS | Status: DC
  Administered 2015-05-19 – 2015-05-21 (×5): 500 mg via INTRAVENOUS
  Filled 2015-05-18 (×5): qty 500

## 2015-05-18 MED ORDER — AMLODIPINE BESYLATE 5 MG PO TABS
5.0000 mg | ORAL_TABLET | Freq: Every day | ORAL | Status: DC
  Administered 2015-05-18 – 2015-05-24 (×6): 5 mg via ORAL
  Filled 2015-05-18 (×8): qty 1

## 2015-05-18 MED ORDER — TRAZODONE HCL 50 MG PO TABS
25.0000 mg | ORAL_TABLET | Freq: Every evening | ORAL | Status: DC | PRN
  Administered 2015-05-18 – 2015-05-21 (×4): 25 mg via ORAL
  Filled 2015-05-18 (×4): qty 1

## 2015-05-18 MED ORDER — VANCOMYCIN HCL IN DEXTROSE 1-5 GM/200ML-% IV SOLN
1000.0000 mg | INTRAVENOUS | Status: AC
  Administered 2015-05-18: 1000 mg via INTRAVENOUS
  Filled 2015-05-18: qty 200

## 2015-05-18 MED ORDER — ASPIRIN EC 81 MG PO TBEC
81.0000 mg | DELAYED_RELEASE_TABLET | Freq: Every day | ORAL | Status: DC
  Administered 2015-05-18 – 2015-05-24 (×7): 81 mg via ORAL
  Filled 2015-05-18 (×8): qty 1

## 2015-05-18 MED ORDER — PANTOPRAZOLE SODIUM 40 MG PO TBEC
40.0000 mg | DELAYED_RELEASE_TABLET | Freq: Every day | ORAL | Status: DC
  Administered 2015-05-18 – 2015-05-24 (×7): 40 mg via ORAL
  Filled 2015-05-18 (×7): qty 1

## 2015-05-18 MED ORDER — INSULIN DETEMIR 100 UNIT/ML ~~LOC~~ SOLN
6.0000 [IU] | Freq: Every day | SUBCUTANEOUS | Status: DC
  Administered 2015-05-18 – 2015-05-23 (×6): 6 [IU] via SUBCUTANEOUS
  Filled 2015-05-18 (×7): qty 0.06

## 2015-05-18 MED ORDER — PIPERACILLIN-TAZOBACTAM 3.375 G IVPB 30 MIN
3.3750 g | INTRAVENOUS | Status: AC
  Administered 2015-05-18: 3.375 g via INTRAVENOUS
  Filled 2015-05-18: qty 50

## 2015-05-18 MED ORDER — TRAMADOL HCL 50 MG PO TABS: 50.0000 mg | ORAL_TABLET | ORAL | Status: DC | PRN

## 2015-05-18 MED ORDER — DEXTROSE 5 % IV SOLN
500.0000 mg | Freq: Once | INTRAVENOUS | Status: DC
  Administered 2015-05-18: 500 mg via INTRAVENOUS
  Filled 2015-05-18: qty 500

## 2015-05-18 MED ORDER — LEVOTHYROXINE SODIUM 112 MCG PO TABS
112.0000 ug | ORAL_TABLET | Freq: Every day | ORAL | Status: DC
  Administered 2015-05-19 – 2015-05-24 (×6): 112 ug via ORAL
  Filled 2015-05-18 (×7): qty 1

## 2015-05-18 NOTE — ED Notes (Signed)
Pt  Ambulated to bathroom HR=80 SPO2=90 on room air.

## 2015-05-18 NOTE — ED Notes (Signed)
Bed: WA04 Expected date:  Expected time:  Means of arrival:  Comments: 

## 2015-05-18 NOTE — ED Provider Notes (Signed)
CSN: [id number]     Arrival date & time 05/18/15  1439 History   First MD Initiated Contact with Patient 05/18/15 1459     Chief Complaint  Patient presents with  . Shortness of Breath     (Consider location/radiation/quality/duration/timing/severity/associated sxs/prior Treatment) Patient is a 81 y.o. female presenting with shortness of breath. The history is provided by the patient.  Shortness of Breath Severity:  Mild Duration:  3 weeks Timing:  Intermittent Progression:  Unchanged Chronicity:  New Context: URI   Relieved by:  Nothing Worsened by:  Nothing tried Associated symptoms: cough   Associated symptoms: no abdominal pain, no chest pain, no fever, no rash and no vomiting     Past Medical History  Diagnosis Date  . Diabetes mellitus without complication   . Hypertension   . Thyroid disease   . Cancer     breast  . Acute MI 2011?  Marland Kitchen Hyperlipidemia   . Arthritis   . GERD (gastroesophageal reflux disease)    Past Surgical History  Procedure Laterality Date  . Breast surgery      L mastectomy   . Thyroid surgery    . Appendectomy    . Mastectomy      left   No family history on file. History  Substance Use Topics  . Smoking status: Never Smoker   . Smokeless tobacco: Not on file  . Alcohol Use: No   OB History    No data available     Review of Systems  Constitutional: Positive for chills and fatigue. Negative for fever.  Respiratory: Positive for cough, chest tightness and shortness of breath.   Cardiovascular: Negative for chest pain and leg swelling.  Gastrointestinal: Negative for vomiting and abdominal pain.  Skin: Negative for rash.  All other systems reviewed and are negative.     Allergies  Codeine; Statins; and Latex  Home Medications   Prior to Admission medications   Medication Sig Start Date End Date Taking? Authorizing Provider  acetaminophen (TYLENOL) 325 MG tablet Take 650 mg by mouth every 6 (six) hours as needed for  moderate pain or headache.   Yes Historical Provider, MD  amLODipine (NORVASC) 5 MG tablet Take 5 mg by mouth daily.   Yes Historical Provider, MD  aspirin EC 81 MG tablet Take 81 mg by mouth daily.   Yes Historical Provider, MD  benzonatate (TESSALON) 200 MG capsule Take 1 capsule by mouth 3 (three) times daily as needed. 04/30/15  Yes Historical Provider, MD  calcium-vitamin D (OSCAL WITH D) 500-200 MG-UNIT per tablet Take 1 tablet by mouth daily.   Yes Historical Provider, MD  gabapentin (NEURONTIN) 100 MG capsule Take 100 mg by mouth at bedtime.   Yes Historical Provider, MD  glipiZIDE (GLUCOTROL XL) 10 MG 24 hr tablet Take 20 mg by mouth daily.   Yes Historical Provider, MD  hydrochlorothiazide (MICROZIDE) 12.5 MG capsule Take 12.5 mg by mouth daily.   Yes Historical Provider, MD  insulin detemir (LEVEMIR) 100 UNIT/ML injection Inject 6 Units into the skin at bedtime.   Yes Historical Provider, MD  levothyroxine (SYNTHROID, LEVOTHROID) 112 MCG tablet Take 112 mcg by mouth daily.   Yes Historical Provider, MD  linagliptin (TRADJENTA) 5 MG TABS tablet Take 5 mg by mouth daily.   Yes Historical Provider, MD  metoprolol succinate (TOPROL-XL) 50 MG 24 hr tablet Take 50 mg by mouth 2 (two) times daily. Take with or immediately following a meal.   Yes Historical Provider,  MD  Multiple Vitamin (MULTIVITAMIN WITH MINERALS) TABS tablet Take 1 tablet by mouth daily.   Yes Historical Provider, MD  omeprazole (PRILOSEC) 20 MG capsule Take 20 mg by mouth daily.   Yes Historical Provider, MD  senna (SENOKOT) 8.6 MG tablet Take 1 tablet by mouth daily as needed for constipation.   Yes Historical Provider, MD  sodium chloride (OCEAN) 0.65 % SOLN nasal spray Place 1 spray into both nostrils 3 (three) times daily.   Yes Historical Provider, MD  traMADol (ULTRAM) 50 MG tablet Take 50 mg by mouth every 4 (four) hours as needed for moderate pain.    Yes Historical Provider, MD  trandolapril (MAVIK) 4 MG tablet Take 8  mg by mouth daily.   Yes Historical Provider, MD  traZODone (DESYREL) 50 MG tablet Take 25 mg by mouth at bedtime as needed for sleep.   Yes Historical Provider, MD  HYDROcodone-acetaminophen (NORCO/VICODIN) 5-325 MG per tablet Take 0.5-1 tablets by mouth every 4 (four) hours as needed for pain. Patient not taking: Reported on 11/20/2014 12/23/12   Shanon Rosser, MD  levofloxacin (LEVAQUIN) 500 MG tablet Take 1 tablet by mouth daily. 05/13/15   Historical Provider, MD   BP 169/96 mmHg  Pulse 84  Temp(Src) 98 F (36.7 C) (Oral)  Resp 24  SpO2 98% Physical Exam  Constitutional: She is oriented to person, place, and time. She appears well-developed and well-nourished. No distress.  HENT:  Head: Normocephalic and atraumatic.  Mouth/Throat: Oropharynx is clear and moist.  Eyes: EOM are normal. Pupils are equal, round, and reactive to light.  Neck: Normal range of motion. Neck supple.  Cardiovascular: Normal rate and regular rhythm.  Exam reveals no friction rub.   No murmur heard. Pulmonary/Chest: Effort normal and breath sounds normal. No respiratory distress. She has no wheezes. She has no rales.  Abdominal: Soft. She exhibits no distension. There is no tenderness. There is no rebound.  Musculoskeletal: Normal range of motion. She exhibits no edema.  Neurological: She is alert and oriented to person, place, and time.  Skin: Skin is warm. No rash noted. She is not diaphoretic.  Nursing note and vitals reviewed.   ED Course  Procedures (including critical care time) Labs Review Labs Reviewed  CBC - Abnormal; Notable for the following:    WBC 12.7 (*)    All other components within normal limits  BASIC METABOLIC PANEL  I-STAT CG4 LACTIC ACID, ED  I-STAT TROPOININ, ED    Imaging Review Dg Chest 2 View (if Patient Has Fever And/or Copd)  05/18/2015   CLINICAL DATA:  Cough shortness of breath central chest tightness for 3 weeks  EXAM: CHEST  2 VIEW  COMPARISON:  05/06/2015  FINDINGS: Mild  left-sided cardiac enlargement stable. Vascular pattern normal. Mild scarring or atelectasis right lung base with opacity medial left lung base which could represent pneumonia scarring or atelectasis. Left base opacity slightly more prominent when compared to 05/06/2015.  Uncoiling and calcification of the aorta stable. No pleural effusion or pneumothorax. No consolidation or effusion. Hazy soft tissue attenuation over both mid to lower lung zones identical to prior study which appears to be due to breast augmentation.  IMPRESSION: Infiltrate medial left lower lobe. This could represent atelectasis or pneumonia.   Electronically Signed   By: Skipper Cliche M.D.   On: 05/18/2015 15:33     EKG Interpretation   Date/Time:  Saturday May 18 2015 14:52:41 EDT Ventricular Rate:  81 PR Interval:  185 QRS Duration:  122 QT Interval:  414 QTC Calculation: 481 R Axis:   -85 Text Interpretation:  Sinus rhythm Nonspecific IVCD with LAD Inferior  infarct, old Anterior infarct, old Baseline wander in lead(s) V2 No  significant change since last tracing Confirmed by Mingo Amber  MD, [place]  [phone]) on 05/18/2015 3:01:30 PM      MDM   Final diagnoses:  CAP (community acquired pneumonia)    46F here with SOB, general malaise, fatigue. Worsen this morning. His had intermittently for the past 3 weeks. She's been treated with Cipro, Z-Pak, Levaquin for pneumonia and bronchitis. She said she's felt better at times but not much worse today. She was put on oxygen last week by her PCP. Here no hypoxia, vitals otherwise stable. Lungs clear without any rhonchi. She has a very wet cough and appears weak. She did receive breathing treatments or weights and round. Labs initially showed a white count of 12.7.  CXR shows RML pneumonia. Patient admitted. Rocephin/Azithromycin given.  I have reviewed all labs and imaging and considered them in my medical decision making.   Evelina Bucy, MD 05/18/15 1911

## 2015-05-18 NOTE — Progress Notes (Signed)
ANTIBIOTIC CONSULT NOTE - INITIAL  Pharmacy Consult for Vancomycin and Zosyn Indication: rule out pneumonia  Allergies  Allergen Reactions  . Codeine Nausea And Vomiting  . Statins     Muscle pain   . Latex Itching and Rash    Patient Measurements:   Adjusted Body Weight:  Vital Signs: Temp: 98 F (36.7 C) (05/28 1459) Temp Source: Oral (05/28 1459) BP: 156/73 mmHg (05/28 1632) Pulse Rate: 78 (05/28 1632) Intake/Output from previous day:   Intake/Output from this shift:    Labs:  Recent Labs  05/18/15 1500  WBC 12.7*  HGB 14.8  PLT 227  CREATININE 0.44   CrCl cannot be calculated (Unknown ideal weight.). No results for input(s): VANCOTROUGH, VANCOPEAK, VANCORANDOM, GENTTROUGH, GENTPEAK, GENTRANDOM, TOBRATROUGH, TOBRAPEAK, TOBRARND, AMIKACINPEAK, AMIKACINTROU, AMIKACIN in the last 72 hours.   Microbiology: No results found for this or any previous visit (from the past 720 hour(s)).  Medical History: Past Medical History  Diagnosis Date  . Diabetes mellitus without complication   . Hypertension   . Thyroid disease   . Cancer     breast  . Acute MI 2011?  Marland Kitchen Hyperlipidemia   . Arthritis   . GERD (gastroesophageal reflux disease)     Medications:  Anti-infectives    Start     Dose/Rate Route Frequency Ordered Stop   05/18/15 1600  cefTRIAXone (ROCEPHIN) 1 g in dextrose 5 % 50 mL IVPB     1 g 100 mL/hr over 30 Minutes Intravenous  Once 05/18/15 1555     05/18/15 1600  azithromycin (ZITHROMAX) 500 mg in dextrose 5 % 250 mL IVPB     500 mg 250 mL/hr over 60 Minutes Intravenous  Once 05/18/15 1555       Assessment: 81yo F with SOB and fatigue. Recently treated as an outpatient with courses of Cipro, Azithromycin, and Levaquin. A singe-dose of Rocephin was given in the ED. Pharmacy is asked to start Vanc and Zosyn.  SCr wnl, CrCl ~56N.    Goal of Therapy:  Vancomycin trough level 15-20 mcg/ml  Appropriate antibiotic dosing for renal function;  eradication of infection  Plan:  Vancomycin 1g IV now then we will order a regimen once the weight is updated. Zosyn 3.375g IV Q8H infused over 4hrs. Measure Vanc trough at steady state. Follow up renal fxn, culture results, and clinical course.  Romeo Rabon, PharmD, pager [phone]. 05/18/2015,5:16 PM.

## 2015-05-18 NOTE — H&P (Signed)
[facility] History and Physical  Lori Schwartz TKZ:[id number] DOB: [birth year] DOA: 05/18/2015  Referring physician: EDP PCP: Mathews Argyle, MD   Chief Complaint: sob for 3 weeks.   HPI: Lori Schwartz is a 81 y.o. female with prior h/o pneumonia, hypertension and DM presents to ED with worsening sob assocaited with productive cough,low grade fevers and myalgias. Reports travel to [place] and to a NHF to visit a friend and funeral. Since then shd developed the above symptoms, was seen in PCP office three times already and was given 3 different antibiotics, levaquin, cipro , but she reports the symptoms would wax and wane , but remain persistent. On arrival to ED, she is requiring 2 lit of [place] oxygen to keep sats at 94% and her CXR revealed persistent pneumonia. She will be admitted to medical service for further evaluation and management.   Review of Systems:  Constitutional:  No weight loss, night sweats, Fevers, chills, fatigue.  HEENT:  No headaches, Difficulty swallowing,Tooth/dental problems,Sore throat,  No sneezing, itching, ear ache, nasal congestion, post nasal drip,  Cardio-vascular:  No chest pain, Orthopnea, PND, swelling in lower extremities, anasarca, dizziness, palpitations  GI:  No heartburn, indigestion, abdominal pain, nausea, vomiting, diarrhea, change in bowel habits, loss of appetite  Resp:  SOB on exertion, cough and congestion for 3 to4  Weeks. Now.  Skin:  no rash or lesions.  GU:  no dysuria, change in color of urine, no urgency or frequency. No flank pain.  Musculoskeletal:  No joint pain or swelling. No decreased range of motion. No back pain.  Psych:  No change in mood or affect. No depression or anxiety. No memory loss.   Past Medical History  Diagnosis Date  . Diabetes mellitus without complication   . Hypertension   . Thyroid disease   . Cancer     breast  . Acute MI 2011?  Marland Kitchen Hyperlipidemia   . Arthritis   . GERD  (gastroesophageal reflux disease)    Past Surgical History  Procedure Laterality Date  . Breast surgery      L mastectomy   . Thyroid surgery    . Appendectomy    . Mastectomy      left   Social History:  reports that she has never smoked. She does not have any smokeless tobacco history on file. She reports that she does not drink alcohol or use illicit drugs.  Allergies  Allergen Reactions  . Codeine Nausea And Vomiting  . Statins     Muscle pain   . Latex Itching and Rash    No family history on file.  FAMILY HISTORY OF HEART FAILURE IN MULTIPLE FAMILY MEMBERS.   Prior to Admission medications   Medication Sig Start Date End Date Taking? Authorizing Provider  acetaminophen (TYLENOL) 325 MG tablet Take 650 mg by mouth every 6 (six) hours as needed for moderate pain or headache.   Yes Historical Provider, MD  amLODipine (NORVASC) 5 MG tablet Take 5 mg by mouth daily.   Yes Historical Provider, MD  aspirin EC 81 MG tablet Take 81 mg by mouth daily.   Yes Historical Provider, MD  benzonatate (TESSALON) 200 MG capsule Take 1 capsule by mouth 3 (three) times daily as needed. 04/30/15  Yes Historical Provider, MD  calcium-vitamin D (OSCAL WITH D) 500-200 MG-UNIT per tablet Take 1 tablet by mouth daily.   Yes Historical Provider, MD  gabapentin (NEURONTIN) 100 MG capsule Take 100 mg by mouth at bedtime.  Yes Historical Provider, MD  glipiZIDE (GLUCOTROL XL) 10 MG 24 hr tablet Take 20 mg by mouth daily.   Yes Historical Provider, MD  hydrochlorothiazide (MICROZIDE) 12.5 MG capsule Take 12.5 mg by mouth daily.   Yes Historical Provider, MD  insulin detemir (LEVEMIR) 100 UNIT/ML injection Inject 6 Units into the skin at bedtime.   Yes Historical Provider, MD  levothyroxine (SYNTHROID, LEVOTHROID) 112 MCG tablet Take 112 mcg by mouth daily.   Yes Historical Provider, MD  linagliptin (TRADJENTA) 5 MG TABS tablet Take 5 mg by mouth daily.   Yes Historical Provider, MD  metoprolol succinate  (TOPROL-XL) 50 MG 24 hr tablet Take 50 mg by mouth 2 (two) times daily. Take with or immediately following a meal.   Yes Historical Provider, MD  Multiple Vitamin (MULTIVITAMIN WITH MINERALS) TABS tablet Take 1 tablet by mouth daily.   Yes Historical Provider, MD  omeprazole (PRILOSEC) 20 MG capsule Take 20 mg by mouth daily.   Yes Historical Provider, MD  senna (SENOKOT) 8.6 MG tablet Take 1 tablet by mouth daily as needed for constipation.   Yes Historical Provider, MD  sodium chloride (OCEAN) 0.65 % SOLN nasal spray Place 1 spray into both nostrils 3 (three) times daily.   Yes Historical Provider, MD  traMADol (ULTRAM) 50 MG tablet Take 50 mg by mouth every 4 (four) hours as needed for moderate pain.    Yes Historical Provider, MD  trandolapril (MAVIK) 4 MG tablet Take 8 mg by mouth daily.   Yes Historical Provider, MD  traZODone (DESYREL) 50 MG tablet Take 25 mg by mouth at bedtime as needed for sleep.   Yes Historical Provider, MD  HYDROcodone-acetaminophen (NORCO/VICODIN) 5-325 MG per tablet Take 0.5-1 tablets by mouth every 4 (four) hours as needed for pain. Patient not taking: Reported on 11/20/2014 12/23/12   Shanon Rosser, MD  levofloxacin (LEVAQUIN) 500 MG tablet Take 1 tablet by mouth daily. 05/13/15   Historical Provider, MD   Physical Exam: Filed Vitals:   05/18/15 1459 05/18/15 1543 05/18/15 1632 05/18/15 1650  BP: 169/96 176/89 156/73   Pulse: 84 79 78   Temp: 98 F (36.7 C)     TempSrc: Oral     Resp: 24 20 22    SpO2: 98% 93% 93% 96%    Wt Readings from Last 3 Encounters:  11/20/14 65.091 kg (143 lb 8 oz)  12/23/12 70.308 kg (155 lb)    General:  Appears calm and comfortable Eyes: PERRL, normal lids, irises & conjunctiva Neck: no LAD, masses or thyromegaly Cardiovascular: RRR, no m/r/g.  Respiratory: CTA bilaterally, no w/r/r. TACHYPNEA. Abdomen: soft, ntnd Skin: no rash or induration seen on limited exam Musculoskeletal: 2+ PEDAL EDEMA.  Neurologic: grossly non-focal.           Labs on Admission:  Basic Metabolic Panel:  Recent Labs Lab 05/18/15 1500  NA 135  K 4.0  CL 92*  CO2 32  GLUCOSE 122*  BUN 20  CREATININE 0.44  CALCIUM 9.1   Liver Function Tests: No results for input(s): AST, ALT, ALKPHOS, BILITOT, PROT, ALBUMIN in the last 168 hours. No results for input(s): LIPASE, AMYLASE in the last 168 hours. No results for input(s): AMMONIA in the last 168 hours. CBC:  Recent Labs Lab 05/18/15 1500  WBC 12.7*  HGB 14.8  HCT 43.8  MCV 91.6  PLT 227   Cardiac Enzymes: No results for input(s): CKTOTAL, CKMB, CKMBINDEX, TROPONINI in the last 168 hours.  BNP (last 3 results)  Recent Labs  05/18/15 1500  BNP 34.1    ProBNP (last 3 results) No results for input(s): PROBNP in the last 8760 hours.  CBG: No results for input(s): GLUCAP in the last 168 hours.  Radiological Exams on Admission: Dg Chest 2 View (if Patient Has Fever And/or Copd)  05/18/2015   CLINICAL DATA:  Cough shortness of breath central chest tightness for 3 weeks  EXAM: CHEST  2 VIEW  COMPARISON:  05/06/2015  FINDINGS: Mild left-sided cardiac enlargement stable. Vascular pattern normal. Mild scarring or atelectasis right lung base with opacity medial left lung base which could represent pneumonia scarring or atelectasis. Left base opacity slightly more prominent when compared to 05/06/2015.  Uncoiling and calcification of the aorta stable. No pleural effusion or pneumothorax. No consolidation or effusion. Hazy soft tissue attenuation over both mid to lower lung zones identical to prior study which appears to be due to breast augmentation.  IMPRESSION: Infiltrate medial left lower lobe. This could represent atelectasis or pneumonia.   Electronically Signed   By: Skipper Cliche M.D.   On: 05/18/2015 15:33    EKG: SINUS RHYTHM with LAD   Assessment/Plan Active Problems:   Pneumonia   CAP (community acquired pneumonia)   failied outpatient treatment of CAP with  three antibiotics: Admit to tele, start her on vancomycin and zosyn.  Oxygen as needed. Blood cultures and sputum cultures ordered.  If she does nt' improve in the next 24 to 48 hours, please get CT of the chest.    Diabetes mellitus: ssi and resume home meds.    Hypertension:  Not well controlled. Resume home meds and monitor.    Leukocytosis: Probably from pneumonia.     bilateral pedal edema : No baseline echo. Get probnp and echocardiogram.    Code Status: presumed full code.  DVT Prophylaxis: Family Communication: family at bedside Disposition Plan: pending further management.   Time spent: 55 min  [facility] Pager [phone]

## 2015-05-18 NOTE — ED Notes (Signed)
Cultures being drawn at present time.

## 2015-05-18 NOTE — ED Notes (Addendum)
Per EMS pt diagnoses with bronchitis worsening to pneumonia over past month; currently being treated with antibiotics. Uses 2 lpm [place] at home continuously since diagnosis. Pt current complaint of increased weakness, fatigue, and SOB.   PT GIVEN SOLU MEDROL 125 MG AND 5 MG OF ALBUTEROL EN ROUTE WITH EMS.

## 2015-05-19 ENCOUNTER — Inpatient Hospital Stay (HOSPITAL_COMMUNITY): Payer: Medicare Other

## 2015-05-19 DIAGNOSIS — R0600 Dyspnea, unspecified: Secondary | ICD-10-CM

## 2015-05-19 DIAGNOSIS — D72829 Elevated white blood cell count, unspecified: Secondary | ICD-10-CM

## 2015-05-19 LAB — GLUCOSE, CAPILLARY
GLUCOSE-CAPILLARY: 245 mg/dL — AB (ref 65–99)
GLUCOSE-CAPILLARY: 201 mg/dL — AB (ref 65–99)
GLUCOSE-CAPILLARY: 217 mg/dL — AB (ref 65–99)
Glucose-Capillary: 169 mg/dL — ABNORMAL HIGH (ref 65–99)

## 2015-05-19 LAB — STREP PNEUMONIAE URINARY ANTIGEN: Strep Pneumo Urinary Antigen: NEGATIVE

## 2015-05-19 LAB — HIV ANTIBODY (ROUTINE TESTING W REFLEX): HIV Screen 4th Generation wRfx: NONREACTIVE

## 2015-05-19 MED ORDER — GLUCERNA SHAKE PO LIQD
237.0000 mL | Freq: Three times a day (TID) | ORAL | Status: DC
  Administered 2015-05-19 – 2015-05-22 (×3): 237 mL via ORAL
  Filled 2015-05-19 (×13): qty 237

## 2015-05-19 MED ORDER — PIPERACILLIN-TAZOBACTAM 3.375 G IVPB
3.3750 g | Freq: Three times a day (TID) | INTRAVENOUS | Status: DC
  Administered 2015-05-19 – 2015-05-21 (×7): 3.375 g via INTRAVENOUS
  Filled 2015-05-19 (×7): qty 50

## 2015-05-19 NOTE — Progress Notes (Signed)
[facility] PROGRESS NOTE  Lori Schwartz ZGY:[id number] DOB: 1925/03/16 DOA: 05/18/2015 PCP: Mathews Argyle, MD  Assessment/Plan: 1. CAP without sepsis 1. Failed outpt treatment 2. CXR with findings of infiltrate of the medial LLL 3. Patient is continued on vancomycin and zosyn 4. Pt has noted some improvement overnight 5. Will order flutter valve for secretions 2. DM 1. On SSI coverage 2. On tradjenta 3. HTN 1. BP stable and controlled 2. Cont current regimen 4. Leukocytosis 1. Likely secondary to presenting CAP 2. Cont abx per above 5. B LE edema possibly secondary to venous stasis 1. 2d echo with normal EF of 65-70% 6. DVT prophylaxis 1. Lovenox  Code Status: Full Family Communication: Pt in room, daughter at bedside (indicate person spoken with, relationship, and if by phone, the number) Disposition Plan: Pending   Consultants:    Procedures:    Antibiotics:  Vancomycin 5/28>>>  Zosyn 5/28>>>  HPI/Subjective: Feels better today.  Objective: Filed Vitals:   05/18/15 2145 05/19/15 0527 05/19/15 1003 05/19/15 1414  BP: 160/91 136/96  128/62  Pulse: 90 70  66  Temp: 97.7 F (36.5 C) 98.1 F (36.7 C)  97.6 F (36.4 C)  TempSrc: Oral Oral  Oral  Resp: 20 20  18   Height:      Weight:      SpO2: 92% 97% 83% 96%    Intake/Output Summary (Last 24 hours) at 05/19/15 1605 Last data filed at 05/19/15 1442  Gross per 24 hour  Intake    915 ml  Output    902 ml  Net     13 ml   Filed Weights   05/18/15 1723 05/18/15 1758  Weight: 61.689 kg (136 lb) 63.64 kg (140 lb 4.8 oz)    Exam:   General:  Awake, in nad  Cardiovascular: regular, s1, s2  Respiratory: normal resp effort, no wheezing  Abdomen: soft,nondistended  Musculoskeletal: perfused, no clubbing   Data Reviewed: Basic Metabolic Panel:  Recent Labs Lab 05/18/15 1500  NA 135  K 4.0  CL 92*  CO2 32  GLUCOSE 122*  BUN 20  CREATININE 0.44  CALCIUM 9.1   Liver  Function Tests: No results for input(s): AST, ALT, ALKPHOS, BILITOT, PROT, ALBUMIN in the last 168 hours. No results for input(s): LIPASE, AMYLASE in the last 168 hours. No results for input(s): AMMONIA in the last 168 hours. CBC:  Recent Labs Lab 05/18/15 1500  WBC 12.7*  HGB 14.8  HCT 43.8  MCV 91.6  PLT 227   Cardiac Enzymes: No results for input(s): CKTOTAL, CKMB, CKMBINDEX, TROPONINI in the last 168 hours. BNP (last 3 results)  Recent Labs  05/18/15 1500  BNP 34.1    ProBNP (last 3 results) No results for input(s): PROBNP in the last 8760 hours.  CBG:  Recent Labs Lab 05/18/15 1804 05/18/15 2140 05/19/15 0739 05/19/15 1227  GLUCAP 260* 245* 169* 201*    Recent Results (from the past 240 hour(s))  Culture, blood (routine x 2)     Status: None (Preliminary result)   Collection Time: 05/18/15  4:21 PM  Result Value Ref Range Status   Specimen Description BLOOD RIGHT ARM  Final   Special Requests   Final    BOTTLES DRAWN AEROBIC AND ANAEROBIC 5CC BOTH BOTTLES   Culture   Final           BLOOD CULTURE RECEIVED NO GROWTH TO DATE CULTURE WILL BE HELD FOR 5 DAYS BEFORE ISSUING A FINAL NEGATIVE REPORT Performed  at [facility]    Report Status PENDING  Incomplete  Culture, blood (routine x 2)     Status: None (Preliminary result)   Collection Time: 05/18/15  4:25 PM  Result Value Ref Range Status   Specimen Description BLOOD LEFT ARM  Final   Special Requests   Final    BOTTLES DRAWN AEROBIC AND ANAEROBIC 5CC BOTH BOTTLES   Culture   Final           BLOOD CULTURE RECEIVED NO GROWTH TO DATE CULTURE WILL BE HELD FOR 5 DAYS BEFORE ISSUING A FINAL NEGATIVE REPORT Performed at [facility]    Report Status PENDING  Incomplete     Studies: Dg Chest 2 View (if Patient Has Fever And/or Copd)  05/18/2015   CLINICAL DATA:  Cough shortness of breath central chest tightness for 3 weeks  EXAM: CHEST  2 VIEW  COMPARISON:  05/06/2015  FINDINGS: Mild  left-sided cardiac enlargement stable. Vascular pattern normal. Mild scarring or atelectasis right lung base with opacity medial left lung base which could represent pneumonia scarring or atelectasis. Left base opacity slightly more prominent when compared to 05/06/2015.  Uncoiling and calcification of the aorta stable. No pleural effusion or pneumothorax. No consolidation or effusion. Hazy soft tissue attenuation over both mid to lower lung zones identical to prior study which appears to be due to breast augmentation.  IMPRESSION: Infiltrate medial left lower lobe. This could represent atelectasis or pneumonia.   Electronically Signed   By: Skipper Cliche M.D.   On: 05/18/2015 15:33    Scheduled Meds: . amLODipine  5 mg Oral Daily  . aspirin EC  81 mg Oral Daily  . enoxaparin (LOVENOX) injection  40 mg Subcutaneous Q24H  . feeding supplement (GLUCERNA SHAKE)  237 mL Oral TID BM  . gabapentin  100 mg Oral QHS  . insulin aspart  0-9 Units Subcutaneous TID WC  . insulin detemir  6 Units Subcutaneous QHS  . levothyroxine  112 mcg Oral QAC breakfast  . linagliptin  5 mg Oral Daily  . metoprolol succinate  50 mg Oral BID  . pantoprazole  40 mg Oral Daily  . piperacillin-tazobactam (ZOSYN)  IV  3.375 g Intravenous Q8H  . trandolapril  8 mg Oral Daily  . vancomycin  500 mg Intravenous Q12H   Continuous Infusions:   Active Problems:   Pneumonia   CAP (community acquired pneumonia)   Lori Schwartz, [facility] Pager [phone]. If 7PM-7AM, please contact night-coverage at [email or web address], password [facility] 05/19/2015, 4:05 PM  LOS: 1 day

## 2015-05-19 NOTE — Progress Notes (Signed)
Pt has melatonin at home & pharm says this could be used with MD order. Texted this info to Dr Wyline Copas.Lori Schwartz, [employer]

## 2015-05-19 NOTE — Progress Notes (Signed)
Dr Wyline Copas returned call & expressed concern re: if pt added melatonin to trazadone she could become hyper-somnolent & may even code. Discussed with pt & she agreed to hold off on the melatonin for now. Dionysios Massman, [employer]

## 2015-05-19 NOTE — Progress Notes (Addendum)
Pt asking to add melatonin to her sleep med & asking to change ensure to glucerna. Text msg sent to Dr Wyline Copas re: these items. Zyion Doxtater, [employer]

## 2015-05-19 NOTE — Evaluation (Signed)
Physical Therapy Evaluation Patient Details Name: Lori Schwartz MRN: [id number] DOB: 03/16/1925 Today's Date: 05/19/2015   History of Present Illness  81 y.o. female admitted with CAP. She has a h/o of DM, HTN, MI, breast cancer.   Clinical Impression  Pt admitted with above diagnosis. Pt currently with functional limitations due to the deficits listed below (see PT Problem List). Pt ambulated 61' with RW and supervision this morning. SaO2 83% on RA walking, 95% on 2L O2. Pt fatigues quickly.  Pt will benefit from skilled PT to increase their independence and safety with mobility to allow discharge to the venue listed below.       Follow Up Recommendations Home health PT    Equipment Recommendations  Other (comment) (shower chair with back)    Recommendations for Other Services OT consult     Precautions / Restrictions Precautions Precautions: Fall Precaution Comments: 3 falls in past year Restrictions Weight Bearing Restrictions: No      Mobility  Bed Mobility               General bed mobility comments: NT -up in chair  Transfers Overall transfer level: Needs assistance   Transfers: Sit to/from Stand Sit to Stand: Supervision         General transfer comment: close supervision due to h/o falls, pt pushed up from B armrests of recliner  Ambulation/Gait Ambulation/Gait assistance: Min guard Ambulation Distance (Feet): 75 Feet Assistive device: Rolling walker (2 wheeled) Gait Pattern/deviations: Step-through pattern;Trunk flexed     General Gait Details: SaO2 83% on RA walking, 95% on 2L O2, cues to inhale thru nose as pt tends to mouth breathe, steady with RW, distance limited by fatigue/SOB, 2/4 dyspnea  Stairs            Wheelchair Mobility    Modified Rankin (Stroke Patients Only)       Balance Overall balance assessment: Needs assistance   Sitting balance-Leahy Scale: Good       Standing balance-Leahy Scale: Fair                                Pertinent Vitals/Pain Pain Assessment: No/denies pain    Home Living Family/patient expects to be discharged to:: Private residence Living Arrangements: Children Available Help at Discharge: Family;Available PRN/intermittently Type of Home: House Home Access: Ramped entrance     Home Layout: One level Home Equipment: [place] - 4 wheels;Shower seat Additional Comments: walk in shower, pt reports having difficulty getting in/out of shower, feels she needs a taller shower seat with back    Prior Function Level of Independence: Independent with assistive device(s)               Hand Dominance        Extremity/Trunk Assessment   Upper Extremity Assessment: Overall WFL for tasks assessed           Lower Extremity Assessment: Generalized weakness (-4/5 quads Bilaterally)      Cervical / Trunk Assessment: Kyphotic  Communication   Communication: No difficulties  Cognition Arousal/Alertness: Awake/alert Behavior During Therapy: WFL for tasks assessed/performed Overall Cognitive Status: Within Functional Limits for tasks assessed                      General Comments      Exercises        Assessment/Plan    PT Assessment Patient needs continued PT services  PT  Diagnosis Difficulty walking;Generalized weakness   PT Problem List Decreased strength;Decreased mobility;Decreased balance;Cardiopulmonary status limiting activity;Decreased activity tolerance  PT Treatment Interventions Gait training;Functional mobility training;Therapeutic activities;Patient/family education;Balance training;Therapeutic exercise   PT Goals (Current goals can be found in the Care Plan section) Acute Rehab PT Goals Patient Stated Goal: to return home, not get so tired with standing PT Goal Formulation: With patient Time For Goal Achievement: 06/02/15 Potential to Achieve Goals: Good    Frequency Min 3X/week   Barriers to discharge         Co-evaluation               End of Session Equipment Utilized During Treatment: Gait belt;Oxygen Activity Tolerance: Patient tolerated treatment well Patient left: in chair;with call bell/phone within reach Nurse Communication: Mobility status         Time: 2336-1224 PT Time Calculation (min) (ACUTE ONLY): 24 min   Charges:   PT Evaluation $Initial PT Evaluation Tier I: 1 Procedure PT Treatments $Gait Training: 8-22 mins   PT G Codes:        Philomena Doheny 05/19/2015, 10:08 AM [phone]

## 2015-05-19 NOTE — Progress Notes (Signed)
  Echocardiogram 2D Echocardiogram has been performed.  Lori Schwartz 05/19/2015, 12:08 PM

## 2015-05-20 LAB — BASIC METABOLIC PANEL
Anion gap: 7 (ref 5–15)
BUN: 23 mg/dL — ABNORMAL HIGH (ref 6–20)
CO2: 35 mmol/L — ABNORMAL HIGH (ref 22–32)
CREATININE: 0.57 mg/dL (ref 0.44–1.00)
Calcium: 8.7 mg/dL — ABNORMAL LOW (ref 8.9–10.3)
Chloride: 97 mmol/L — ABNORMAL LOW (ref 101–111)
GFR calc Af Amer: >60 mL/min (ref >60)
GFR calc non Af Amer: >60 mL/min (ref >60)
GLUCOSE: 98 mg/dL (ref 65–99)
Potassium: 3.8 mmol/L (ref 3.5–5.1)
Sodium: 139 mmol/L (ref 135–145)

## 2015-05-20 LAB — CBC
HEMATOCRIT: 36.7 % (ref 36.0–46.0)
Hemoglobin: 11.9 g/dL — ABNORMAL LOW (ref 12.0–15.0)
MCH: 29.8 pg (ref 26.0–34.0)
MCHC: 32.4 g/dL (ref 30.0–36.0)
MCV: 92 fL (ref 78.0–100.0)
PLATELETS: 184 10*3/uL (ref 150–400)
RBC: 3.99 MIL/uL (ref 3.87–5.11)
RDW: 13.7 % (ref 11.5–15.5)
WBC: 9.1 10*3/uL (ref 4.0–10.5)

## 2015-05-20 LAB — GLUCOSE, CAPILLARY
GLUCOSE-CAPILLARY: 91 mg/dL (ref 65–99)
GLUCOSE-CAPILLARY: 225 mg/dL — AB (ref 65–99)
Glucose-Capillary: 192 mg/dL — ABNORMAL HIGH (ref 65–99)
Glucose-Capillary: 150 mg/dL — ABNORMAL HIGH (ref 65–99)

## 2015-05-20 LAB — LEGIONELLA ANTIGEN, URINE

## 2015-05-20 NOTE — Progress Notes (Signed)
[facility] PROGRESS NOTE  Lori Schwartz DXA:[id number] DOB: 1925-01-10 DOA: 05/18/2015 PCP: Mathews Argyle, MD  Assessment/Plan: 1. CAP without sepsis 1. Pt had failed outpt abx treatment 2. CXR with findings of infiltrate of the medial LLL 3. Patient is continued on vancomycin and zosyn 4. Remains afebrile. No leukocytosis 5. Reports symptomatic improvement with flutter valve 6. Pt reports generalized malaise. Will check respiratory viral panel 2. DM 1. On SSI coverage 2. On tradjenta 3. Stable 3. HTN 1. BP stable and controlled 2. Cont current regimen 4. Leukocytosis 1. Likely secondary to presenting CAP 2. Cont abx per above 5. B LE edema possibly secondary to venous stasis 1. 2d echo with normal EF of 65-70% 6. DVT prophylaxis 1. Lovenox  Code Status: Full Family Communication: Pt in room, family at bedside Disposition Plan: Pending   Consultants:    Procedures:    Antibiotics:  Vancomycin 5/28>>>  Zosyn 5/28>>>  HPI/Subjective: Reports feeling "horrible" today. States arms and legs "feel like mush."  Objective: Filed Vitals:   05/19/15 2108 05/20/15 0540 05/20/15 1331 05/20/15 1450  BP: 138/54 149/61  125/68  Pulse: 70 64  66  Temp: 98 F (36.7 C) 97.7 F (36.5 C)  98.1 F (36.7 C)  TempSrc: Oral Oral  Oral  Resp: 18 18  18   Height:      Weight:      SpO2: 98% 99% 97% 94%   No intake or output data in the 24 hours ending 05/20/15 1729 Filed Weights   05/18/15 1723 05/18/15 1758  Weight: 61.689 kg (136 lb) 63.64 kg (140 lb 4.8 oz)    Exam:   General:  Awake, sitting in chair, in nad  Cardiovascular: regular, s1, s2  Respiratory: normal resp effort, no wheezing  Abdomen: soft,nondistended, pos BS  Musculoskeletal: perfused, no clubbing   Data Reviewed: Basic Metabolic Panel:  Recent Labs Lab 05/18/15 1500 05/20/15 0515  NA 135 139  K 4.0 3.8  CL 92* 97*  CO2 32 35*  GLUCOSE 122* 98  BUN 20 23*  CREATININE  0.44 0.57  CALCIUM 9.1 8.7*   Liver Function Tests: No results for input(s): AST, ALT, ALKPHOS, BILITOT, PROT, ALBUMIN in the last 168 hours. No results for input(s): LIPASE, AMYLASE in the last 168 hours. No results for input(s): AMMONIA in the last 168 hours. CBC:  Recent Labs Lab 05/18/15 1500 05/20/15 0515  WBC 12.7* 9.1  HGB 14.8 11.9*  HCT 43.8 36.7  MCV 91.6 92.0  PLT 227 184   Cardiac Enzymes: No results for input(s): CKTOTAL, CKMB, CKMBINDEX, TROPONINI in the last 168 hours. BNP (last 3 results)  Recent Labs  05/18/15 1500  BNP 34.1    ProBNP (last 3 results) No results for input(s): PROBNP in the last 8760 hours.  CBG:  Recent Labs Lab 05/19/15 1227 05/19/15 1655 05/19/15 2107 05/20/15 0809 05/20/15 1718  GLUCAP 201* 217* 192* 91 225*    Recent Results (from the past 240 hour(s))  Culture, blood (routine x 2)     Status: None (Preliminary result)   Collection Time: 05/18/15  4:21 PM  Result Value Ref Range Status   Specimen Description BLOOD RIGHT ARM  Final   Special Requests   Final    BOTTLES DRAWN AEROBIC AND ANAEROBIC 5CC BOTH BOTTLES   Culture   Final           BLOOD CULTURE RECEIVED NO GROWTH TO DATE CULTURE WILL BE HELD FOR 5 DAYS BEFORE ISSUING A FINAL  NEGATIVE REPORT Performed at [facility]    Report Status PENDING  Incomplete  Culture, blood (routine x 2)     Status: None (Preliminary result)   Collection Time: 05/18/15  4:25 PM  Result Value Ref Range Status   Specimen Description BLOOD LEFT ARM  Final   Special Requests   Final    BOTTLES DRAWN AEROBIC AND ANAEROBIC 5CC BOTH BOTTLES   Culture   Final           BLOOD CULTURE RECEIVED NO GROWTH TO DATE CULTURE WILL BE HELD FOR 5 DAYS BEFORE ISSUING A FINAL NEGATIVE REPORT Performed at [facility]    Report Status PENDING  Incomplete     Studies: No results found.  Scheduled Meds: . amLODipine  5 mg Oral Daily  . aspirin EC  81 mg Oral Daily  .  enoxaparin (LOVENOX) injection  40 mg Subcutaneous Q24H  . feeding supplement (GLUCERNA SHAKE)  237 mL Oral TID BM  . gabapentin  100 mg Oral QHS  . insulin aspart  0-9 Units Subcutaneous TID WC  . insulin detemir  6 Units Subcutaneous QHS  . levothyroxine  112 mcg Oral QAC breakfast  . linagliptin  5 mg Oral Daily  . metoprolol succinate  50 mg Oral BID  . pantoprazole  40 mg Oral Daily  . piperacillin-tazobactam (ZOSYN)  IV  3.375 g Intravenous Q8H  . trandolapril  8 mg Oral Daily  . vancomycin  500 mg Intravenous Q12H   Continuous Infusions:   Active Problems:   Pneumonia   CAP (community acquired pneumonia)   [facility], [facility] Pager [phone]. If 7PM-7AM, please contact night-coverage at [email or web address], password [facility] 05/20/2015, 5:29 PM  LOS: 2 days

## 2015-05-20 NOTE — Evaluation (Signed)
Clinical/Bedside Swallow Evaluation Patient Details  Name: Lori Schwartz MRN: [id number] Date of Birth: Apr 26, 1925  Today's Date: 05/20/2015 Time: SLP Start Time (ACUTE ONLY): 0909 SLP Stop Time (ACUTE ONLY): 0935 SLP Time Calculation (min) (ACUTE ONLY): 26 min  Past Medical History:  Past Medical History  Diagnosis Date  . Diabetes mellitus without complication   . Hypertension   . Thyroid disease   . Cancer     breast  . Acute MI 2011?  Marland Kitchen Hyperlipidemia   . Arthritis   . GERD (gastroesophageal reflux disease)    Past Surgical History:  Past Surgical History  Procedure Laterality Date  . Breast surgery      L mastectomy   . Thyroid surgery    . Appendectomy    . Mastectomy      left   HPI:  81 yo female with h/o breast cancer, thyroid dx, GERD, arthritis admitted after failed OP tx for pna.  Pt CXR showed medial left lower lobe infiltrate = ? ATX vs pna.  Pt has undergone UGI study in 2004 showing tertiary contractions in mid and distal esophagus, no reflux or HH.  Swallow eval ordered.    Assessment / Plan / Recommendation Clinical Impression  BSE completed, full report to follow.  Pt with negative CN exam of nerves impacting oropharyngeal swallow.  Dentition intact and voice clear.  Observed pt consuming coffee and bacon - timely swallow, adequate laryngeal elevation palpated at bedside with no indication of residuals nor airway compromise.    Pt has known h/o tertiary contractions in mid to distal esophagus without HH or reflux per UGI study in 2004 and she reports being on reflux medicine for "a long time".    She does report occasional problems "strangling" on small pieces of food that clear with liquid swallows.  Episodes have worsened since hospital admission per patient-not suprisingly.  Advised pt to monitor her swallow ability and report to her primary MD if not improving s/p pna recovery.    Pt denies weight loss until last week and reports this is her first  pneumonia.    Provided pt with esophageal precautions and heimlich maneuver in writing. Reviewed aspiration precautions for maximal airway protection, especially given weakness in cough.  Recommend continue regular/thin with precautions.  SLP to sign off as all education completed.      Aspiration Risk  Mild    Diet Recommendation Age appropriate regular solids;Thin   Medication Administration: Whole meds with liquid Compensations: Slow rate;Multiple dry swallows after each bite/sip;Small sips/bites    Other  Recommendations Oral Care Recommendations: Oral care BID   Follow Up Recommendations    n/a   Frequency and Duration      n/a  Pertinent Vitals/Pain Afebrile, decreased     Swallow Study Prior Functional Status   see [place] Date of Onset: 05/20/15 Other Pertinent Information: 81 yo female with h/o breast cancer, thyroid dx, GERD, arthritis admitted after failed OP tx for pna.  Pt CXR showed medial left lower lobe infiltrate = ? ATX vs pna.  Pt has undergone UGI study in 2004 showing tertiary contractions in mid and distal esophagus, no reflux or HH.  Swallow eval ordered.  Type of Study: Bedside swallow evaluation Diet Prior to this Study: Regular;Thin liquids Temperature Spikes Noted: No Respiratory Status: Supplemental O2 delivered via (comment) (2 liters, SLP replaced upon entrance to room) History of Recent Intubation: No Behavior/Cognition: Alert;Cooperative;Pleasant mood Oral Cavity - Dentition: Adequate natural  dentition/normal for age Self-Feeding Abilities: Able to feed self Patient Positioning: Upright in bed Baseline Vocal Quality: Normal Volitional Cough: Weak Volitional Swallow: Able to elicit    Oral/Motor/Sensory Function Overall Oral Motor/Sensory Function: Appears within functional limits for tasks assessed   Ice Chips Ice chips: Not tested   Thin Liquid Thin Liquid: Within functional limits Presentation: Self Fed;Cup    Nectar Thick Nectar  Thick Liquid: Not tested   Honey Thick Honey Thick Liquid: Not tested   Puree Puree: Not tested   Solid   GO    Solid: Within functional limits Presentation: [place], [facility] SLP [phone]

## 2015-05-20 NOTE — Progress Notes (Signed)
Initial Nutrition Assessment  DOCUMENTATION CODES:  Not applicable  INTERVENTION:  Glucerna shake  NUTRITION DIAGNOSIS:  Inadequate oral intake related to acute illness as evidenced by per patient/family report.  GOAL:  Patient will meet greater than or equal to 90% of their needs  MONITOR:  PO intake, Supplement acceptance, Weight trends, Labs, I & O's  REASON FOR ASSESSMENT:  Malnutrition Screening Tool  ASSESSMENT: 81 y.o. female with prior h/o pneumonia, hypertension and DM presents to ED with worsening sob assocaited with productive cough,low grade fevers and myalgias.  Pt seen for MST. Pt reports that for lunch she ate 1/2 chicken breast and a cup of potato soup. Per chart review, pt has been eating 75-95% of meals since admission. She states that appetite is improving but that it was fair PTA for approximately 3 weeks. Pt states that she likes milkshakes but does not like the taste of Glucerna Shake but understands the importance of drinking them with poor intakes.   She feels that she may have lost 4 lbs with recent decrease in appetite. Per chart review, pt has lost 3 lbs in past 6 months which is not significant for time frame.   Pt likely meeting needs at this time but will continue to monitor. Medications reviewed. Labs reviewed; CBGs: 91-260 mg/dL, Cl: 97 mmol/L, BUN elevated.  Height:  Ht Readings from Last 1 Encounters:  05/18/15 5\' 5"  (1.651 m)    Weight:  Wt Readings from Last 1 Encounters:  05/18/15 140 lb 4.8 oz (63.64 kg)    Ideal Body Weight:  56.8 kg (kg)  Wt Readings from Last 10 Encounters:  05/18/15 140 lb 4.8 oz (63.64 kg)  11/20/14 143 lb 8 oz (65.091 kg)  12/23/12 155 lb (70.308 kg)    BMI:  Body mass index is 23.35 kg/(m^2).  Estimated Nutritional Needs:  Kcal:  1300-1500  Protein:  60-75 grams  Fluid:  2-2.2 L/day  Skin:  Reviewed, no issues  Diet Order:  Diet Carb Modified Fluid consistency:: Thin; Room service  appropriate?: Yes  EDUCATION NEEDS:  No education needs identified at this time   Intake/Output Summary (Last 24 hours) at 05/20/15 1345 Last data filed at 05/19/15 1644  Gross per 24 hour  Intake    477 ml  Output      1 ml  Net    476 ml   Last BM:  5/29    Jarome Matin, RD, LDN Inpatient Clinical Dietitian Pager # [phone] After hours/weekend pager # [phone]

## 2015-05-20 NOTE — Evaluation (Signed)
BSE completed, full report to follow.  Pt with negative CN exam of nerves impacting oropharyngeal swallow.  Dentition intact and voice clear.  Observed pt consuming coffee and bacon - timely swallow, adequate laryngeal elevation palpated at bedside with no indication of residuals nor airway compromise.  Pt has known h/o tertiary contractions in mid to distal esophagus without HH or reflux per UGI study in 2004 and she reports being on reflux medicine for "a long time".  She does report occasional problems "strangling" on small pieces of food that clear with liquid swallows.  Episodes have worsened since hospital admission per patient-not suprisingly.  Pt denies weight loss until last week and reports this is her first pneumonia.      Denies pill dysphagia stating she can take multiple pills at once.   Advised pt to monitor her swallowing closely and provided esophageal precautions given her hx.    Luanna Salk, [facility] SLP [phone]

## 2015-05-21 ENCOUNTER — Inpatient Hospital Stay (HOSPITAL_COMMUNITY): Payer: Medicare Other

## 2015-05-21 DIAGNOSIS — K5909 Other constipation: Secondary | ICD-10-CM

## 2015-05-21 LAB — BASIC METABOLIC PANEL
ANION GAP: 9 (ref 5–15)
BUN: 15 mg/dL (ref 6–20)
CALCIUM: 8.8 mg/dL — AB (ref 8.9–10.3)
CHLORIDE: 99 mmol/L — AB (ref 101–111)
CO2: 35 mmol/L — AB (ref 22–32)
Creatinine, Ser: 0.5 mg/dL (ref 0.44–1.00)
GFR calc Af Amer: >60 mL/min (ref >60)
GFR calc non Af Amer: >60 mL/min (ref >60)
Glucose, Bld: 95 mg/dL (ref 65–99)
Potassium: 3.5 mmol/L (ref 3.5–5.1)
SODIUM: 143 mmol/L (ref 135–145)

## 2015-05-21 LAB — CBC
HEMATOCRIT: 39.3 % (ref 36.0–46.0)
Hemoglobin: 12.7 g/dL (ref 12.0–15.0)
MCH: 29.9 pg (ref 26.0–34.0)
MCHC: 32.3 g/dL (ref 30.0–36.0)
MCV: 92.5 fL (ref 78.0–100.0)
Platelets: 181 10*3/uL (ref 150–400)
RBC: 4.25 MIL/uL (ref 3.87–5.11)
RDW: 13.8 % (ref 11.5–15.5)
WBC: 11.5 10*3/uL — ABNORMAL HIGH (ref 4.0–10.5)

## 2015-05-21 LAB — GLUCOSE, CAPILLARY
GLUCOSE-CAPILLARY: 239 mg/dL — AB (ref 65–99)
Glucose-Capillary: 101 mg/dL — ABNORMAL HIGH (ref 65–99)
Glucose-Capillary: 156 mg/dL — ABNORMAL HIGH (ref 65–99)

## 2015-05-21 MED ORDER — DEXTROSE 5 % IV SOLN
500.0000 mg | INTRAVENOUS | Status: DC
  Administered 2015-05-21: 500 mg via INTRAVENOUS
  Filled 2015-05-21: qty 500

## 2015-05-21 MED ORDER — CEFTRIAXONE SODIUM IN DEXTROSE 20 MG/ML IV SOLN
1.0000 g | INTRAVENOUS | Status: DC
  Administered 2015-05-21: 1 g via INTRAVENOUS
  Filled 2015-05-21: qty 50

## 2015-05-21 MED ORDER — ONDANSETRON HCL 4 MG/2ML IJ SOLN
4.0000 mg | Freq: Four times a day (QID) | INTRAMUSCULAR | Status: DC | PRN
  Administered 2015-05-21 – 2015-05-23 (×4): 4 mg via INTRAVENOUS
  Filled 2015-05-21 (×4): qty 2

## 2015-05-21 MED ORDER — HYDRALAZINE HCL 20 MG/ML IJ SOLN
5.0000 mg | Freq: Once | INTRAMUSCULAR | Status: AC
  Administered 2015-05-21: 5 mg via INTRAVENOUS
  Filled 2015-05-21: qty 1

## 2015-05-21 MED ORDER — POLYETHYLENE GLYCOL 3350 17 G PO PACK
17.0000 g | PACK | Freq: Every day | ORAL | Status: DC
  Administered 2015-05-21 – 2015-05-24 (×2): 17 g via ORAL
  Filled 2015-05-21 (×3): qty 1

## 2015-05-21 NOTE — Progress Notes (Signed)
ANTIBIOTIC CONSULT NOTE - FOLLOW UP  Pharmacy Consult for Vancomycin / Zosyn Indication: CAP  Allergies  Allergen Reactions  . Codeine Nausea And Vomiting  . Statins     Muscle pain   . Latex Itching and Rash    Patient Measurements: Height: 5\' 5"  (165.1 cm) Weight: 140 lb 4.8 oz (63.64 kg) IBW/kg (Calculated) : 57 Adjusted Body Weight:   Vital Signs: Temp: 97.4 F (36.3 C) (05/31 0648) Temp Source: Oral (05/31 0648) BP: 177/91 mmHg (05/31 0648) Pulse Rate: 71 (05/31 0648) Intake/Output from previous day: 05/30 0701 - 05/31 0700 In: 120 [P.O.:120] Out: -  Intake/Output from this shift:    Labs:  Recent Labs  05/18/15 1500 05/20/15 0515 05/21/15 0508  WBC 12.7* 9.1 11.5*  HGB 14.8 11.9* 12.7  PLT 227 184 181  CREATININE 0.44 0.57 0.50   Estimated Creatinine Clearance: 44.6 mL/min (by C-G formula based on Cr of 0.5). No results for input(s): VANCOTROUGH, VANCOPEAK, VANCORANDOM, GENTTROUGH, GENTPEAK, GENTRANDOM, TOBRATROUGH, TOBRAPEAK, TOBRARND, AMIKACINPEAK, AMIKACINTROU, AMIKACIN in the last 72 hours.   Microbiology: Recent Results (from the past 720 hour(s))  Culture, blood (routine x 2)     Status: None (Preliminary result)   Collection Time: 05/18/15  4:21 PM  Result Value Ref Range Status   Specimen Description BLOOD RIGHT ARM  Final   Special Requests   Final    BOTTLES DRAWN AEROBIC AND ANAEROBIC 5CC BOTH BOTTLES   Culture   Final           BLOOD CULTURE RECEIVED NO GROWTH TO DATE CULTURE WILL BE HELD FOR 5 DAYS BEFORE ISSUING A FINAL NEGATIVE REPORT Performed at [facility]    Report Status PENDING  Incomplete  Culture, blood (routine x 2)     Status: None (Preliminary result)   Collection Time: 05/18/15  4:25 PM  Result Value Ref Range Status   Specimen Description BLOOD LEFT ARM  Final   Special Requests   Final    BOTTLES DRAWN AEROBIC AND ANAEROBIC 5CC BOTH BOTTLES   Culture   Final           BLOOD CULTURE RECEIVED NO GROWTH TO  DATE CULTURE WILL BE HELD FOR 5 DAYS BEFORE ISSUING A FINAL NEGATIVE REPORT Performed at [facility]    Report Status PENDING  Incomplete    Anti-infectives    Start     Dose/Rate Route Frequency Ordered Stop   05/19/15 1200  piperacillin-tazobactam (ZOSYN) IVPB 3.375 g     3.375 g 12.5 mL/hr over 240 Minutes Intravenous Every 8 hours 05/19/15 1035     05/19/15 1000  vancomycin (VANCOCIN) 500 mg in sodium chloride 0.9 % 100 mL IVPB     500 mg 100 mL/hr over 60 Minutes Intravenous Every 12 hours 05/18/15 1802     05/18/15 1800  piperacillin-tazobactam (ZOSYN) IVPB 3.375 g     3.375 g 100 mL/hr over 30 Minutes Intravenous STAT 05/18/15 1712 05/18/15 1755   05/18/15 1800  vancomycin (VANCOCIN) IVPB 1000 mg/200 mL premix     1,000 mg 200 mL/hr over 60 Minutes Intravenous STAT 05/18/15 1717 05/18/15 1918   05/18/15 1600  cefTRIAXone (ROCEPHIN) 1 g in dextrose 5 % 50 mL IVPB     1 g 100 mL/hr over 30 Minutes Intravenous  Once 05/18/15 1555 05/18/15 1709   05/18/15 1600  azithromycin (ZITHROMAX) 500 mg in dextrose 5 % 250 mL IVPB  Status:  Discontinued     500 mg 250 mL/hr  over 60 Minutes Intravenous  Once 05/18/15 1555 05/18/15 1711      Assessment: 89 yoF from home with persistent CAP who failed outpatient therapy. Started on IV antibiotics. No antibiotic allergies. CXR shows LLL infiltrate  5/31: Afebrile. WBCs sl elev, SCr low, stable. CrCl ~45 ml/min.   5/28 >> Rocephin x 1 5/28 >> Zosyn >> 5/28 >> Vanc >>    5/28 blood x2: ngtd 5/30 Resp viral panel: IP  Goal of Therapy:  Vancomycin trough level 15-20 mcg/ml  Eradication of infection  Plan:  Check VT tomorrow if pt continued on IV antibiotics.   Continue Zosyn and Vancomycin at current doses.  F/u renal fxn, cultures, clinical course.  Ralene Bathe, PharmD, BCPS 05/21/2015, 8:18 AM  Pager: [phone]

## 2015-05-21 NOTE — Progress Notes (Signed)
Physical Therapy Treatment Patient Details Name: SYEDA PRICKETT MRN: [id number] DOB: 08-14-1925 Today's Date: 05/21/2015    History of Present Illness 81 y.o. female admitted with CAP. She has a h/o of DM, HTN, MI, breast cancer.     PT Comments    Pt not feeing well today with increased c/o nausea.      SATURATION QUALIFICATIONS: (This note is used to comply with regulatory documentation for home oxygen)  Patient Saturations on Room Air at Rest = 92%  Patient Saturations on Room Air while Ambulating = 90%    Please briefly explain why patient needs home oxygen:     O2 was not needed    Follow Up Recommendations  Home health PT     Equipment Recommendations   (has a walker)    Recommendations for Other Services       Precautions / Restrictions Precautions Precautions: Fall Precaution Comments: 3 falls in past year Restrictions Weight Bearing Restrictions: No    Mobility  Bed Mobility Overal bed mobility: Modified Independent             General bed mobility comments: increased time  Transfers Overall transfer level: Needs assistance Equipment used: None Transfers: Sit to/from Stand Sit to Stand: Supervision         General transfer comment: good safety cognition and use of hands  Ambulation/Gait Ambulation/Gait assistance: Min guard Ambulation Distance (Feet): 45 Feet Assistive device: Rolling walker (2 wheeled) Gait Pattern/deviations: Step-through pattern;Decreased stride length Gait velocity: decreased   General Gait Details: amb on RA lowest sat 90% with RW decreased distance due to increased c/o "not feeling good".    Stairs            Wheelchair Mobility    Modified Rankin (Stroke Patients Only)       Balance                                    Cognition Arousal/Alertness: Awake/alert Behavior During Therapy: WFL for tasks assessed/performed Overall Cognitive Status: Within Functional Limits for tasks  assessed                      Exercises      General Comments        Pertinent Vitals/Pain Pain Assessment: No/denies pain    Home Living                      Prior Function            PT Goals (current goals can now be found in the care plan section) Progress towards PT goals: Progressing toward goals    Frequency  Min 3X/week    PT Plan      Co-evaluation             End of Session Equipment Utilized During Treatment: Gait belt Activity Tolerance: Patient limited by fatigue Patient left: in bed;with call bell/phone within reach     Time: 1145-1200 PT Time Calculation (min) (ACUTE ONLY): 15 min  Charges:  $Therapeutic Activity: 8-22 mins                    G Codes:      Rica Koyanagi  PTA [facility]  Acute  Rehab Pager      [phone]

## 2015-05-21 NOTE — Progress Notes (Addendum)
[facility] PROGRESS NOTE  Lori Schwartz ORV:[id number] DOB: 06/05/1925 DOA: 05/18/2015 PCP: Mathews Argyle, MD   Off Service Summary: [phone] who failed outpt tx for PNA presents with hypoxia, found to have LLL PNA. Pt initially continued on vancomycin and zosyn, transitioned to azithro and rocephin as of 5/31. The patient also has nausea with mild abd distension and prior history of constipation.  Assessment/Plan: 1. CAP without sepsis 1. Pt had failed outpt abx treatment 2. Presenting CXR with findings of infiltrate of the medial LLL 3. Patient had been continued on vancomycin and zosyn 4. Patient remains afebrile with no leukocytosis 5. Reports symptomatic improvement with flutter valve 6. Respiratory viral panel pending 7. Will narrow abx regimen to azithro with rocephin. If patient remains stable, consider discharging on oral azithro with ceftin 8. Of note, pt reportedly failed levaquin and cipro as outpt 2. DM 1. On SSI coverage 2. On tradjenta 3. Overall stable 3. HTN 1. BP stable and controlled 2. Cont current regimen 4. Leukocytosis 1. Likely secondary to presenting CAP 2. Cont on abx per above 5. B LE edema possibly secondary to venous stasis 1. 2d echo with normal EF of 65-70% 6. Nausea with suspected constipation 1. Abd mildly distended 2. Pt with a hx of constipation  3. Will obtain abd xray to r/o obstruction 4. Pending results of xray, would start cathartics 7. DVT prophylaxis 1. Lovenox  Code Status: Full Family Communication: Pt in room, daughter at bedside Disposition Plan: Pending   Consultants:    Procedures:    Antibiotics:  Vancomycin 5/28>>>5/31  Zosyn 5/28>>>5/31  Azithromycin 5/31>>>  Rocephin 5/31>>>  HPI/Subjective: Complains of nausea, decreased appetitie  Objective: Filed Vitals:   05/20/15 1450 05/20/15 1733 05/20/15 2221 05/21/15 0648  BP: 125/68  187/82 177/91  Pulse: 66  77 71  Temp: 98.1 F (36.7 C)   98.2 F (36.8 C) 97.4 F (36.3 C)  TempSrc: Oral  Oral Oral  Resp: 18  18 18   Height:      Weight:      SpO2: 94% 95% 92% 96%    Intake/Output Summary (Last 24 hours) at 05/21/15 1341 Last data filed at 05/21/15 1100  Gross per 24 hour  Intake      0 ml  Output    600 ml  Net   -600 ml   Filed Weights   05/18/15 1723 05/18/15 1758  Weight: 61.689 kg (136 lb) 63.64 kg (140 lb 4.8 oz)    Exam:   General:  Awake,laying in bed, in nad  Cardiovascular: regular, s1, s2  Respiratory: normal resp effort, no wheezing  Abdomen: soft,pos BS, mildly distended  Musculoskeletal: perfused, no clubbing   Data Reviewed: Basic Metabolic Panel:  Recent Labs Lab 05/18/15 1500 05/20/15 0515 05/21/15 0508  NA 135 139 143  K 4.0 3.8 3.5  CL 92* 97* 99*  CO2 32 35* 35*  GLUCOSE 122* 98 95  BUN 20 23* 15  CREATININE 0.44 0.57 0.50  CALCIUM 9.1 8.7* 8.8*   Liver Function Tests: No results for input(s): AST, ALT, ALKPHOS, BILITOT, PROT, ALBUMIN in the last 168 hours. No results for input(s): LIPASE, AMYLASE in the last 168 hours. No results for input(s): AMMONIA in the last 168 hours. CBC:  Recent Labs Lab 05/18/15 1500 05/20/15 0515 05/21/15 0508  WBC 12.7* 9.1 11.5*  HGB 14.8 11.9* 12.7  HCT 43.8 36.7 39.3  MCV 91.6 92.0 92.5  PLT 227 184 181   Cardiac Enzymes: No results  for input(s): CKTOTAL, CKMB, CKMBINDEX, TROPONINI in the last 168 hours. BNP (last 3 results)  Recent Labs  05/18/15 1500  BNP 34.1    ProBNP (last 3 results) No results for input(s): PROBNP in the last 8760 hours.  CBG:  Recent Labs Lab 05/20/15 0809 05/20/15 1718 05/20/15 2210 05/21/15 0742 05/21/15 1128  GLUCAP 91 225* 150* 101* 156*    Recent Results (from the past 240 hour(s))  Culture, blood (routine x 2)     Status: None (Preliminary result)   Collection Time: 05/18/15  4:21 PM  Result Value Ref Range Status   Specimen Description BLOOD RIGHT ARM  Final   Special  Requests   Final    BOTTLES DRAWN AEROBIC AND ANAEROBIC 5CC BOTH BOTTLES   Culture   Final           BLOOD CULTURE RECEIVED NO GROWTH TO DATE CULTURE WILL BE HELD FOR 5 DAYS BEFORE ISSUING A FINAL NEGATIVE REPORT Performed at [facility]    Report Status PENDING  Incomplete  Culture, blood (routine x 2)     Status: None (Preliminary result)   Collection Time: 05/18/15  4:25 PM  Result Value Ref Range Status   Specimen Description BLOOD LEFT ARM  Final   Special Requests   Final    BOTTLES DRAWN AEROBIC AND ANAEROBIC 5CC BOTH BOTTLES   Culture   Final           BLOOD CULTURE RECEIVED NO GROWTH TO DATE CULTURE WILL BE HELD FOR 5 DAYS BEFORE ISSUING A FINAL NEGATIVE REPORT Performed at [facility]    Report Status PENDING  Incomplete     Studies: No results found.  Scheduled Meds: . amLODipine  5 mg Oral Daily  . aspirin EC  81 mg Oral Daily  . azithromycin  500 mg Intravenous Q24H  . cefTRIAXone (ROCEPHIN)  IV  1 g Intravenous Q24H  . enoxaparin (LOVENOX) injection  40 mg Subcutaneous Q24H  . feeding supplement (GLUCERNA SHAKE)  237 mL Oral TID BM  . gabapentin  100 mg Oral QHS  . insulin aspart  0-9 Units Subcutaneous TID WC  . insulin detemir  6 Units Subcutaneous QHS  . levothyroxine  112 mcg Oral QAC breakfast  . linagliptin  5 mg Oral Daily  . metoprolol succinate  50 mg Oral BID  . pantoprazole  40 mg Oral Daily  . trandolapril  8 mg Oral Daily   Continuous Infusions:   Active Problems:   Pneumonia   CAP (community acquired pneumonia)   Antero Derosia, [place] [facility] Pager [phone]. If 7PM-7AM, please contact night-coverage at [email or web address], password [facility] 05/21/2015, 1:41 PM  LOS: 3 days

## 2015-05-21 NOTE — Care Management Note (Addendum)
Case Management Note  Patient Details  Name: Lori Schwartz MRN: [id number] Date of Birth: [birth year]  Subjective/Objective:     81 yo female admitted with pna from home               Action/Plan: Spoke with [facility] reps and concerns from family resolved. Patient and daughter are requesting HH RN, PT, OT, aide, and SW and Lori Schwartz with [facility] has been made aware of referral. Patient wants to remain with [facility] as home O2 provider and understands the best fit test that needs to be performed by [facility] prior to setting patient up with her portable system at home. Patient requesting shower chair prior to dc. [name] is the primary contact for the patient, her cell is [phone], and home [phone].  Spoke with patient and daughter at bedside. Patient lives at home and the children and DIL rotate throughout the day so someone is in the house with the patient all day but they need assistance with a caregiver at night. Provided the patient and daughter with a private agency caregiver list and explained that it is a private expense. The patient stated that she has a walker, cane, BSC, elevated toilet seat and shower chair but would like a shower seat that is elevated for ease of use. They also stated that they thought the PCP had ordered [facility] services with [facility] but they have not received any HH services prior to discharge, they would like [place] services in place upon dc, preferably HH RN, PT, OT, aide. They aldo stated that the patient has home O2 in place but not the portable set up and request further information regarding getting the portable O2 system in place. This CM l/m for Mikal Plane, [facility] reps to clarify the above concerns prior to dc. Expected Discharge Date:  05/22/15               Expected Discharge Plan:  [place]  In-House Referral:     Discharge planning Services  CM Consult  Post Acute Care Choice:  Home Health, Durable Medical Equipment Choice offered to:  Patient,  Adult Children  DME Arranged:    DME Agency:     HH Arranged:    [place] Agency:     Status of Service:  In process, will continue to follow  Medicare Important Message Given:  Yes Date Medicare IM Given:  05/21/15 Medicare IM give by:  Leanne Chang Date Additional Medicare IM Given:    Additional Medicare Important Message give by:     If discussed at [place] of Stay Meetings, dates discussed:    Additional Comments:  Scot Dock, RN 05/21/2015, 12:43 PM

## 2015-05-22 ENCOUNTER — Inpatient Hospital Stay (HOSPITAL_COMMUNITY): Payer: Medicare Other

## 2015-05-22 DIAGNOSIS — E119 Type 2 diabetes mellitus without complications: Secondary | ICD-10-CM

## 2015-05-22 DIAGNOSIS — I10 Essential (primary) hypertension: Secondary | ICD-10-CM | POA: Diagnosis present

## 2015-05-22 DIAGNOSIS — J189 Pneumonia, unspecified organism: Secondary | ICD-10-CM

## 2015-05-22 DIAGNOSIS — J9601 Acute respiratory failure with hypoxia: Secondary | ICD-10-CM

## 2015-05-22 LAB — RESPIRATORY VIRUS PANEL
Adenovirus: NEGATIVE
Influenza A: NEGATIVE
Influenza B: NEGATIVE
Metapneumovirus: NEGATIVE
PARAINFLUENZA 2 A: NEGATIVE
PARAINFLUENZA 3 A: NEGATIVE
Parainfluenza 1: NEGATIVE
RESPIRATORY SYNCYTIAL VIRUS A: NEGATIVE
RHINOVIRUS: NEGATIVE
Respiratory Syncytial Virus B: NEGATIVE

## 2015-05-22 LAB — CBC
HCT: 40.2 % (ref 36.0–46.0)
HEMOGLOBIN: 13 g/dL (ref 12.0–15.0)
MCH: 30.6 pg (ref 26.0–34.0)
MCHC: 32.3 g/dL (ref 30.0–36.0)
MCV: 94.6 fL (ref 78.0–100.0)
PLATELETS: 178 10*3/uL (ref 150–400)
RBC: 4.25 MIL/uL (ref 3.87–5.11)
RDW: 14.2 % (ref 11.5–15.5)
WBC: 9.9 10*3/uL (ref 4.0–10.5)

## 2015-05-22 LAB — GLUCOSE, CAPILLARY
GLUCOSE-CAPILLARY: 157 mg/dL — AB (ref 65–99)
Glucose-Capillary: 97 mg/dL (ref 65–99)
Glucose-Capillary: 123 mg/dL — ABNORMAL HIGH (ref 65–99)

## 2015-05-22 LAB — BASIC METABOLIC PANEL
ANION GAP: 8 (ref 5–15)
BUN: 15 mg/dL (ref 6–20)
CO2: 34 mmol/L — AB (ref 22–32)
Calcium: 9 mg/dL (ref 8.9–10.3)
Chloride: 97 mmol/L — ABNORMAL LOW (ref 101–111)
Creatinine, Ser: 0.72 mg/dL (ref 0.44–1.00)
GFR calc non Af Amer: >60 mL/min (ref >60)
GLUCOSE: 90 mg/dL (ref 65–99)
Potassium: 3.4 mmol/L — ABNORMAL LOW (ref 3.5–5.1)
Sodium: 139 mmol/L (ref 135–145)

## 2015-05-22 MED ORDER — VANCOMYCIN HCL 500 MG IV SOLR
500.0000 mg | Freq: Two times a day (BID) | INTRAVENOUS | Status: DC
  Administered 2015-05-22 – 2015-05-24 (×5): 500 mg via INTRAVENOUS
  Filled 2015-05-22 (×6): qty 500

## 2015-05-22 MED ORDER — PIPERACILLIN-TAZOBACTAM 3.375 G IVPB
3.3750 g | Freq: Three times a day (TID) | INTRAVENOUS | Status: DC
  Administered 2015-05-22 – 2015-05-24 (×7): 3.375 g via INTRAVENOUS
  Filled 2015-05-22 (×9): qty 50

## 2015-05-22 NOTE — Evaluation (Signed)
Clinical/Bedside Swallow Evaluation Patient Details  Name: Lori Schwartz MRN: [id number] Date of Birth: [birth year]  Today's Date: 05/22/2015 Time: SLP Start Time (ACUTE ONLY): 1455 SLP Stop Time (ACUTE ONLY): 1530 SLP Time Calculation (min) (ACUTE ONLY): 35 min  Past Medical History:  Past Medical History  Diagnosis Date  . Diabetes mellitus without complication   . Hypertension   . Thyroid disease   . Cancer     breast  . Acute MI 2011?  [facility] Hyperlipidemia   . Arthritis   . GERD (gastroesophageal reflux disease)    Past Surgical History:  Past Surgical History  Procedure Laterality Date  . Breast surgery      L mastectomy   . Thyroid surgery    . Appendectomy    . Mastectomy      left   HPI:  81 yo female with h/o breast cancer, thyroid dx, GERD, arthritis admitted after failed OP tx for pna.  Pt CXR showed medial left lower lobe infiltrate = ? ATX vs pna.  Pt has undergone UGI study in 2004 showing tertiary contractions in mid and distal esophagus, no reflux or HH.  Swallow eval completed 5/30 and was reordered today - presumed to be due to CXR worsening 05/22/15.      Assessment / Plan / Recommendation Clinical Impression  Pt continues with negative CN exam of nerves impacting oropharyngeal swallow.  Dentition intact and voice remains clear.  Pt wiling to consume 2 graham crackers, 2 ounces applesauce and 1 ounce water.  Swallow was timely with clear voice throughout.  No indications of airway compromse, residuals or deficits.    Pt again reconfirms occasional issues with "strangling" on small pieces of food that she did not coorelate to specific time of day or during meal duration.  She even indicates she thought she may need the heimlich manuever prior to admission due to choking on a small piece of cracker.   She denies anything abnormal with eating habit regarding this episode.    Pt has known h/o tertiary contractions in mid to distal esophagus without HH or reflux per UGI  study in 2004 and she reports being on reflux medicine for "a long time". She denies symptoms of stasis in esophagus.    Suspect if aspiration is a concern, may be due to known esophageal dysmotility to which pt may be desensitized.     She states she does not have problems swallowing and she wants to go home.  Educated pt to recommendations to mitigate aspiration risk including consuming liquids t/o meal to faciliate clearance.  Pt reports she does not "drink enough" and her daughter gets onto her re: this issue.    All education complete, no further SLP warranted.  Thanks.     Aspiration Risk  Mild    Diet Recommendation Age appropriate regular solids;Thin   Medication Administration: Whole meds with liquid Compensations: Slow rate;Small sips/bites;Follow solids with liquid (drink liquids t/o meal)    Other  Recommendations Oral Care Recommendations: Oral care QID   Follow Up Recommendations       Frequency and Duration        Pertinent Vitals/Pain Afebrile, decreased    Swallow Study Prior Functional Status   see hhx    General Date of Onset: 05/22/15 Other Pertinent Information: 81 yo female with h/o breast cancer, thyroid dx, GERD, arthritis admitted after failed OP tx for pna.  Pt CXR showed medial left lower lobe infiltrate = ? ATX vs pna.  Pt has undergone UGI study in 2004 showing tertiary contractions in mid and distal esophagus, no reflux or HH.  Swallow eval completed 5/30 and was reordered today - presumed to be due to CXR worsening 05/22/15.    Type of Study: Bedside swallow evaluation Diet Prior to this Study: Regular;Thin liquids Temperature Spikes Noted: No Respiratory Status: Room air History of Recent Intubation: No Behavior/Cognition: Alert;Cooperative;Pleasant mood Oral Cavity - Dentition: Adequate natural dentition/normal for age Self-Feeding Abilities: Able to feed self Patient Positioning: Upright in bed Baseline Vocal Quality: Normal Volitional Cough:  Other (Comment) (dnt) Volitional Swallow: Able to elicit    Oral/Motor/Sensory Function Overall Oral Motor/Sensory Function: Appears within functional limits for tasks assessed   Ice Chips Ice chips: Not tested   Thin Liquid Thin Liquid: Within functional limits Presentation: Self Fed;Straw    Nectar Thick Nectar Thick Liquid: Not tested   Honey Thick Honey Thick Liquid: Not tested   Puree Puree: Within functional limits Presentation: Self Fed;Spoon Other Comments: 2 ounces applesauce   Solid   GO    Solid: Within functional limits Presentation: Self Fed Other Comments: graham crackers, [place], [facility] SLP [phone]

## 2015-05-22 NOTE — Progress Notes (Signed)
Patient is not wanting to take Trazadone at PM anymore. States it makes her "feel drugged" and she "can't seem to get awake." "Something is different with my medications and I just don't know what it is." She would like something else for depression that she can take at night. MD made aware.

## 2015-05-22 NOTE — Progress Notes (Addendum)
[facility] PROGRESS NOTE  Lori Schwartz DSK:[id number] DOB: 04-Dec-1925 DOA: 05/18/2015 PCP: Mathews Argyle, MD   Off Service Summary: [phone] who failed outpt tx for PNA presents with hypoxia, found to have LLL PNA. Pt initially continued on vancomycin and zosyn, transitioned to azithro and rocephin as of 5/31 but has had worsening clinical condition with worsening hypoxemia, fatigue, and CXR findings. The patient also has nausea with mild abd distension and prior history of constipation.  Assessment/Plan: 1. Acute hypoxic respiratory failure secondary to CAP without sepsis.  Question if this may be due to recurrent aspiration causing exacerbations of the pneumonia and slow healing. 1. Pt failed levaquin as outpt 2. S. Pneumo, legionella, and HIV negative 3. Presenting CXR with findings of infiltrate of the medial LLL 4. Patient remains afebrile with no leukocytosis 5. Reports symptomatic improvement with flutter valve 6. Respiratory viral panel pending 7. Worsening fatigue, O2 sat 80% on RA, worsening CXR on 6/1 8. D/c ceftriaxone and azithro and resume vanc and zosyn 9. BCx NGTD 10. Speech evaluation 2. DM 1. On SSI coverage 2. On tradjenta 3. Overall stable 3. HTN 1. BP stable and controlled 2. Cont current regimen 4. Leukocytosis 1. Likely secondary to presenting CAP and resolved 2. Cont on abx per above 5. B LE edema possibly secondary to venous stasis 1. 2d echo with normal EF of 65-70%, grade 1 DD, septal motion with dyssynergy 6. Nausea with suspected constipation 1. Abd mildly distended 2. XR without obvious obstruction 3. Resolved with cathartics 7. DVT prophylaxis 1. Lovenox  Code Status: Full Family Communication: Pt in room, daughter at bedside Disposition Plan: Pending clinical improvement   Consultants:  none  Procedures:  none  Antibiotics:  Vancomycin 5/28>>>5/31, resumed 6/1  Zosyn 5/28>>>5/31, resumed 6/1  Azithromycin 5/31>>>  6/1  Rocephin 5/31>>> 6/1    HPI/Subjective: Feels worse today.  Weak, tired, severe congested cough but only getting up small amounts of white phlegm.  States she sometimes has difficulty swallowing and food goes down "wrong pipe"  Objective: Filed Vitals:   05/21/15 2201 05/22/15 0202 05/22/15 0205 05/22/15 0532  BP: 159/90   158/72  Pulse: 81   84  Temp: 97.8 F (36.6 C)   98.1 F (36.7 C)  TempSrc: Oral   Oral  Resp: 18   18  Height:      Weight:      SpO2: 90% 80% 92% 97%    Intake/Output Summary (Last 24 hours) at 05/22/15 1152 Last data filed at 05/22/15 0900  Gross per 24 hour  Intake    560 ml  Output    750 ml  Net   -190 ml   Filed Weights   05/18/15 1723 05/18/15 1758  Weight: 61.689 kg (136 lb) 63.64 kg (140 lb 4.8 oz)    Exam:   General:  Awake, slumped over in bed, in nad, ill-appearing  Cardiovascular: regular, s1, s2  Respiratory: very diminished at bilateral bases right worse than left with course rhonchi, no focal rales, no wheezing  Abdomen: soft,pos BS, mildly distended  Musculoskeletal: perfused, no clubbing, no LEE  Data Reviewed: Basic Metabolic Panel:  Recent Labs Lab 05/18/15 1500 05/20/15 0515 05/21/15 0508 05/22/15 0520  NA 135 139 143 139  K 4.0 3.8 3.5 3.4*  CL 92* 97* 99* 97*  CO2 32 35* 35* 34*  GLUCOSE 122* 98 95 90  BUN 20 23* 15 15  CREATININE 0.44 0.57 0.50 0.72  CALCIUM 9.1 8.7* 8.8* 9.0  Liver Function Tests: No results for input(s): AST, ALT, ALKPHOS, BILITOT, PROT, ALBUMIN in the last 168 hours. No results for input(s): LIPASE, AMYLASE in the last 168 hours. No results for input(s): AMMONIA in the last 168 hours. CBC:  Recent Labs Lab 05/18/15 1500 05/20/15 0515 05/21/15 0508 05/22/15 0520  WBC 12.7* 9.1 11.5* 9.9  HGB 14.8 11.9* 12.7 13.0  HCT 43.8 36.7 39.3 40.2  MCV 91.6 92.0 92.5 94.6  PLT 227 184 181 178   Cardiac Enzymes: No results for input(s): CKTOTAL, CKMB, CKMBINDEX, TROPONINI in  the last 168 hours. BNP (last 3 results)  Recent Labs  05/18/15 1500  BNP 34.1    ProBNP (last 3 results) No results for input(s): PROBNP in the last 8760 hours.  CBG:  Recent Labs Lab 05/21/15 0742 05/21/15 1128 05/21/15 1633 05/22/15 0750 05/22/15 1120  GLUCAP 101* 156* 239* 97 123*    Recent Results (from the past 240 hour(s))  Culture, blood (routine x 2)     Status: None (Preliminary result)   Collection Time: 05/18/15  4:21 PM  Result Value Ref Range Status   Specimen Description BLOOD RIGHT ARM  Final   Special Requests   Final    BOTTLES DRAWN AEROBIC AND ANAEROBIC 5CC BOTH BOTTLES   Culture   Final           BLOOD CULTURE RECEIVED NO GROWTH TO DATE CULTURE WILL BE HELD FOR 5 DAYS BEFORE ISSUING A FINAL NEGATIVE REPORT Performed at [facility]    Report Status PENDING  Incomplete  Culture, blood (routine x 2)     Status: None (Preliminary result)   Collection Time: 05/18/15  4:25 PM  Result Value Ref Range Status   Specimen Description BLOOD LEFT ARM  Final   Special Requests   Final    BOTTLES DRAWN AEROBIC AND ANAEROBIC 5CC BOTH BOTTLES   Culture   Final           BLOOD CULTURE RECEIVED NO GROWTH TO DATE CULTURE WILL BE HELD FOR 5 DAYS BEFORE ISSUING A FINAL NEGATIVE REPORT Performed at [facility]    Report Status PENDING  Incomplete     Studies: Dg Chest Port 1 View  05/22/2015   CLINICAL DATA:  Community-acquired pneumonia  EXAM: PORTABLE CHEST - 1 VIEW  COMPARISON:  PA and lateral chest x-ray of May 18, 2015  FINDINGS: The lungs are adequately inflated. There is increased density at both bases consistent with atelectasis or developing alveolar pneumonia. A small amount of pleural fluid on the right is present. The cardiac silhouette is enlarged. The pulmonary vascularity is not engorged. The trachea is midline. The bony thorax is unremarkable. There surgical clips in the left axillary region. The bony thorax is unremarkable.   IMPRESSION: Increasing density at the lung bases is consistent with progressive atelectasis or pneumonia. A small right pleural effusion is present. Stable cardiomegaly without pulmonary vascular congestion.   Electronically Signed   By: David  Martinique M.D.   On: 05/22/2015 07:38   Dg Abd Portable 1v  05/21/2015   CLINICAL DATA:  Abdominal pain and constipation  EXAM: PORTABLE ABDOMEN - 1 VIEW  COMPARISON:  01/06/2009  FINDINGS: Scattered large and small bowel gas is noted. No obstructive changes are seen. Mild fecal material is noted within the colon. No findings to suggest impaction are seen. Stable scoliosis of the lumbar spine is noted. Diffuse aortic calcifications are noted without definitive aneurysm.  IMPRESSION: Nonspecific abdomen.   Electronically  Signed   By: Inez Catalina M.D.   On: 05/21/2015 15:16    Scheduled Meds: . amLODipine  5 mg Oral Daily  . aspirin EC  81 mg Oral Daily  . enoxaparin (LOVENOX) injection  40 mg Subcutaneous Q24H  . feeding supplement (GLUCERNA SHAKE)  237 mL Oral TID BM  . gabapentin  100 mg Oral QHS  . insulin aspart  0-9 Units Subcutaneous TID WC  . insulin detemir  6 Units Subcutaneous QHS  . levothyroxine  112 mcg Oral QAC breakfast  . linagliptin  5 mg Oral Daily  . metoprolol succinate  50 mg Oral BID  . pantoprazole  40 mg Oral Daily  . piperacillin-tazobactam (ZOSYN)  IV  3.375 g Intravenous Q8H  . polyethylene glycol  17 g Oral Daily  . trandolapril  8 mg Oral Daily  . vancomycin  500 mg Intravenous Q12H   Continuous Infusions:   Active Problems:   Pneumonia   CAP (community acquired pneumonia)   Arriana Lohmann, [facility]  [facility] Pager [phone]. If 7PM-7AM, please contact night-coverage at [email or web address], password [facility] 05/22/2015, 11:52 AM  LOS: 4 days

## 2015-05-22 NOTE — Progress Notes (Signed)
ANTIBIOTIC CONSULT NOTE - Initial  Pharmacy Consult for Vancomycin / Zosyn Indication: HCAP  Allergies  Allergen Reactions  . Codeine Nausea And Vomiting  . Statins     Muscle pain   . Latex Itching and Rash    Patient Measurements: Height: 5\' 5"  (165.1 cm) Weight: 140 lb 4.8 oz (63.64 kg) IBW/kg (Calculated) : 57  Vital Signs: Temp: 98.1 F (36.7 C) (06/01 0532) Temp Source: Oral (06/01 0532) BP: 158/72 mmHg (06/01 0532) Pulse Rate: 84 (06/01 0532) Intake/Output from previous day: 05/31 0701 - 06/01 0700 In: 560 [P.O.:560] Out: 1350 [Urine:1350] Intake/Output from this shift:    Labs:  Recent Labs  05/20/15 0515 05/21/15 0508 05/22/15 0520  WBC 9.1 11.5* 9.9  HGB 11.9* 12.7 13.0  PLT 184 181 178  CREATININE 0.57 0.50 0.72   Estimated Creatinine Clearance: 44.6 mL/min (by C-G formula based on Cr of 0.72). No results for input(s): VANCOTROUGH, VANCOPEAK, VANCORANDOM, GENTTROUGH, GENTPEAK, GENTRANDOM, TOBRATROUGH, TOBRAPEAK, TOBRARND, AMIKACINPEAK, AMIKACINTROU, AMIKACIN in the last 72 hours.   Microbiology: Recent Results (from the past 720 hour(s))  Culture, blood (routine x 2)     Status: None (Preliminary result)   Collection Time: 05/18/15  4:21 PM  Result Value Ref Range Status   Specimen Description BLOOD RIGHT ARM  Final   Special Requests   Final    BOTTLES DRAWN AEROBIC AND ANAEROBIC 5CC BOTH BOTTLES   Culture   Final           BLOOD CULTURE RECEIVED NO GROWTH TO DATE CULTURE WILL BE HELD FOR 5 DAYS BEFORE ISSUING A FINAL NEGATIVE REPORT Performed at [facility]    Report Status PENDING  Incomplete  Culture, blood (routine x 2)     Status: None (Preliminary result)   Collection Time: 05/18/15  4:25 PM  Result Value Ref Range Status   Specimen Description BLOOD LEFT ARM  Final   Special Requests   Final    BOTTLES DRAWN AEROBIC AND ANAEROBIC 5CC BOTH BOTTLES   Culture   Final           BLOOD CULTURE RECEIVED NO GROWTH TO DATE CULTURE  WILL BE HELD FOR 5 DAYS BEFORE ISSUING A FINAL NEGATIVE REPORT Performed at [facility]    Report Status PENDING  Incomplete    Assessment: 32 yoF from home with persistent CAP who failed outpatient therapy with floroquinolones. CXR shows LLL infiltrate.  Started on Vancomycin and Zosyn on admission then narrowed to Ceftriaxone and Azithromycin yesterday.  This morning, CXR shows increasing density at the lung bases consistent with progressive atelectasis or pneumonia.  Pharmacy has been consulted to resume vancomycin and Zosyn.  Patient remains afebrile, WBC WNL, SCr increased to 0.72 (CrCl~54 ml/min).  5/28 >> Rocephin x 1 5/28 >> Zosyn >> 5/31, 6/1 >> 5/28 >> Vanc >>  5/31, 6/1 >> 5/31 >> Azith >> 6/1 5/31 >> CTX >> 6/1  5/28 blood x2: ngtd 5/30 Resp viral panel: IP  Goal of Therapy:  Vancomycin trough level 15-20 mcg/ml  Eradication of infection  Plan:  1.  Resume vancomycin 500 mg IV q12h. 2.  Resume Zosyn 3.375g IV q8h (4 hour infusion time).  3.  F/u SCr, trough levels, clinical course.  Hershal Coria, PharmD, BCPS Pager: [phone] 05/22/2015 8:18 AM

## 2015-05-22 NOTE — Progress Notes (Signed)
PT Cancellation Note  Patient Details Name: Lori Schwartz MRN: [id number] DOB: May 03, 1925   Cancelled Treatment:     Pt just got back to bed from using bathroom requesting to rest.  Pt is amb with assist to and from bathroom with nursing and that fatigues her.     Rica Koyanagi  PTA [facility]  Acute  Rehab Pager      [phone]

## 2015-05-23 DIAGNOSIS — J9601 Acute respiratory failure with hypoxia: Secondary | ICD-10-CM

## 2015-05-23 DIAGNOSIS — E119 Type 2 diabetes mellitus without complications: Secondary | ICD-10-CM

## 2015-05-23 LAB — BASIC METABOLIC PANEL
Anion gap: 6 (ref 5–15)
BUN: 22 mg/dL — ABNORMAL HIGH (ref 6–20)
CO2: 36 mmol/L — ABNORMAL HIGH (ref 22–32)
Calcium: 9 mg/dL (ref 8.9–10.3)
Chloride: 99 mmol/L — ABNORMAL LOW (ref 101–111)
Creatinine, Ser: 0.89 mg/dL (ref 0.44–1.00)
GFR calc Af Amer: >60 mL/min (ref >60)
GFR calc non Af Amer: 57 mL/min — ABNORMAL LOW (ref >60)
Glucose, Bld: 80 mg/dL (ref 65–99)
POTASSIUM: 3.7 mmol/L (ref 3.5–5.1)
Sodium: 141 mmol/L (ref 135–145)

## 2015-05-23 LAB — GLUCOSE, CAPILLARY
GLUCOSE-CAPILLARY: 101 mg/dL — AB (ref 65–99)
GLUCOSE-CAPILLARY: 147 mg/dL — AB (ref 65–99)
Glucose-Capillary: 162 mg/dL — ABNORMAL HIGH (ref 65–99)
Glucose-Capillary: 204 mg/dL — ABNORMAL HIGH (ref 65–99)
Glucose-Capillary: 191 mg/dL — ABNORMAL HIGH (ref 65–99)

## 2015-05-23 LAB — CBC
HCT: 40.6 % (ref 36.0–46.0)
Hemoglobin: 12.8 g/dL (ref 12.0–15.0)
MCH: 30.4 pg (ref 26.0–34.0)
MCHC: 31.5 g/dL (ref 30.0–36.0)
MCV: 96.4 fL (ref 78.0–100.0)
PLATELETS: 169 10*3/uL (ref 150–400)
RBC: 4.21 MIL/uL (ref 3.87–5.11)
RDW: 14.1 % (ref 11.5–15.5)
WBC: 10 10*3/uL (ref 4.0–10.5)

## 2015-05-23 MED ORDER — GLUCERNA SHAKE PO LIQD
237.0000 mL | Freq: Every morning | ORAL | Status: DC
  Administered 2015-05-24: 237 mL via ORAL
  Filled 2015-05-23: qty 237

## 2015-05-23 NOTE — Progress Notes (Signed)
Physical Therapy Treatment Patient Details Name: MASA LUBIN MRN: [id number] DOB: 1925-01-06 Today's Date: 05/23/2015    History of Present Illness 81 y.o. female admitted with CAP. She has a h/o of DM, HTN, MI, breast cancer.     PT Comments    Pt OOB in recliner feeling "bad".  On 2 lts nasal at rest 97%.  Removed O2 to amb in hallway.  Lowest sat 87%.  Mild c/o dizziness standing BP 133/64.  Overall, pt feels "bad".  Follow Up Recommendations  Home health PT     Equipment Recommendations  None recommended by PT    Recommendations for Other Services       Precautions / Restrictions Precautions Precaution Comments: monitor O2 Restrictions Weight Bearing Restrictions: No    Mobility  Bed Mobility               General bed mobility comments: Pt OOB  Transfers Overall transfer level: Needs assistance Equipment used: None Transfers: Sit to/from Stand Sit to Stand: Supervision         General transfer comment: good safety cognition and use of hands  Ambulation/Gait Ambulation/Gait assistance: Min guard Ambulation Distance (Feet): 55 Feet Assistive device: Rolling walker (2 wheeled) Gait Pattern/deviations: Step-through pattern Gait velocity: decreased   General Gait Details: amb on RA lowest 87% mild c/o dizziness BP standing 133/64.  Overall, pt does not feel well.    Stairs            Wheelchair Mobility    Modified Rankin (Stroke Patients Only)       Balance                                    Cognition Arousal/Alertness: Awake/alert Behavior During Therapy: WFL for tasks assessed/performed Overall Cognitive Status: Within Functional Limits for tasks assessed                      Exercises      General Comments        Pertinent Vitals/Pain Pain Assessment: No/denies pain    Home Living                      Prior Function            PT Goals (current goals can now be found in the care  plan section) Progress towards PT goals: Progressing toward goals    Frequency  Min 3X/week    PT Plan      Co-evaluation             End of Session Equipment Utilized During Treatment: Gait belt Activity Tolerance: Patient limited by fatigue Patient left: in chair;with call bell/phone within reach;with family/visitor present     Time: 1450-1509 PT Time Calculation (min) (ACUTE ONLY): 19 min  Charges:  $Gait Training: 8-22 mins                    G Codes:      Rica Koyanagi  PTA [facility]  Acute  Rehab Pager      [phone]

## 2015-05-23 NOTE — Progress Notes (Signed)
[facility] PROGRESS NOTE  Lori Schwartz HFW:[id number] DOB: [birth year] DOA: 05/18/2015 PCP: Mathews Argyle, MD   Off Service Summary: [phone] who failed outpt tx for PNA presents with hypoxia, found to have LLL PNA. Pt initially continued on vancomycin and zosyn, transitioned to azithro and rocephin as of 5/31 but has had worsening clinical condition with worsening hypoxemia, fatigue, and CXR findings. The patient also has nausea with mild abd distension and prior history of constipation.  Assessment/Plan: 1. Acute hypoxic respiratory failure secondary to CAP without sepsis.  Question if this may be due to recurrent aspiration causing exacerbations of the pneumonia and slow healing. 1. Pt failed levaquin as outpt 2. S. Pneumo, legionella, and HIV negative 3. Presenting CXR with findings of infiltrate of the medial LLL 4. Patient remains afebrile with no leukocytosis 5. Reports symptomatic improvement with flutter valve 6. Respiratory viral panel pending 7. Worsening fatigue, O2 sat 80% on RA, worsening CXR on 6/1 8. D/c ceftriaxone and azithro and resumed vanc and zosyn on 6/1 9. Improved on 6/2 and would like to go home soon 10. BCx NGTD 11. Speech evaluation:  Regular with thin  2. DM 1. On SSI coverage 2. On tradjenta 3. Overall stable 3. HTN 1. BP stable and controlled 2. Cont current regimen 4. Leukocytosis 1. Likely secondary to presenting CAP and resolved 2. Cont on abx per above 5. B LE edema possibly secondary to venous stasis 1. 2d echo with normal EF of 65-70%, grade 1 DD, septal motion with dyssynergy 6. Nausea with suspected constipation 1. Abd mildly distended 2. XR without obvious obstruction 3. Resolved with cathartics 7. DVT prophylaxis 1. Lovenox  Code Status: Full Family Communication: Pt in room, daughter at bedside Disposition Plan:  Likely home tomorrow   Consultants:  none  Procedures:  none  Antibiotics:  Vancomycin 5/28>>>5/31,  resumed 6/1  Zosyn 5/28>>>5/31, resumed 6/1  Azithromycin 5/31>>> 6/1  Rocephin 5/31>>> 6/1    HPI/Subjective: Feels much better today.  Had to put oxygen back on when ambulating with PT but is coughing up more white phlegm which is helping her feel better.  More energy today.    Objective: Filed Vitals:   05/22/15 1211 05/22/15 2114 05/23/15 0605 05/23/15 1408  BP: 125/64 138/71 158/71 115/57  Pulse: 68 66 72 73  Temp: 97.7 F (36.5 C) 98.2 F (36.8 C) 97.7 F (36.5 C) 97.6 F (36.4 C)  TempSrc: Oral Oral Oral Oral  Resp: 18 18 18 20   Height:      Weight:      SpO2: 97% 95% 94% 90%    Intake/Output Summary (Last 24 hours) at 05/23/15 1710 Last data filed at 05/23/15 1400  Gross per 24 hour  Intake    860 ml  Output      0 ml  Net    860 ml   Filed Weights   05/18/15 1723 05/18/15 1758  Weight: 61.689 kg (136 lb) 63.64 kg (140 lb 4.8 oz)    Exam:   General:  Awake, alert, sitting at edge of bed and much better appearing compared to yesterday  Cardiovascular: regular, s1, s2  Respiratory: very diminished at bilateral bases right worse than left with course rhonchi, no focal rales, no wheezing, similar to yesterday  Abdomen: soft,pos BS, mildly distended  Musculoskeletal: perfused, no clubbing, no LEE  Data Reviewed: Basic Metabolic Panel:  Recent Labs Lab 05/18/15 1500 05/20/15 0515 05/21/15 0508 05/22/15 0520 05/23/15 0525  NA 135 139 143 139 141  K 4.0 3.8 3.5 3.4* 3.7  CL 92* 97* 99* 97* 99*  CO2 32 35* 35* 34* 36*  GLUCOSE 122* 98 95 90 80  BUN 20 23* 15 15 22*  CREATININE 0.44 0.57 0.50 0.72 0.89  CALCIUM 9.1 8.7* 8.8* 9.0 9.0   Liver Function Tests: No results for input(s): AST, ALT, ALKPHOS, BILITOT, PROT, ALBUMIN in the last 168 hours. No results for input(s): LIPASE, AMYLASE in the last 168 hours. No results for input(s): AMMONIA in the last 168 hours. CBC:  Recent Labs Lab 05/18/15 1500 05/20/15 0515 05/21/15 0508  05/22/15 0520 05/23/15 0525  WBC 12.7* 9.1 11.5* 9.9 10.0  HGB 14.8 11.9* 12.7 13.0 12.8  HCT 43.8 36.7 39.3 40.2 40.6  MCV 91.6 92.0 92.5 94.6 96.4  PLT 227 184 181 178 169   Cardiac Enzymes: No results for input(s): CKTOTAL, CKMB, CKMBINDEX, TROPONINI in the last 168 hours. BNP (last 3 results)  Recent Labs  05/18/15 1500  BNP 34.1    ProBNP (last 3 results) No results for input(s): PROBNP in the last 8760 hours.  CBG:  Recent Labs Lab 05/22/15 1120 05/22/15 1703 05/22/15 2131 05/23/15 0746 05/23/15 1206  GLUCAP 123* 157* 162* 101* 147*    Recent Results (from the past 240 hour(s))  Culture, blood (routine x 2)     Status: None (Preliminary result)   Collection Time: 05/18/15  4:21 PM  Result Value Ref Range Status   Specimen Description BLOOD RIGHT ARM  Final   Special Requests   Final    BOTTLES DRAWN AEROBIC AND ANAEROBIC 5CC BOTH BOTTLES   Culture   Final           BLOOD CULTURE RECEIVED NO GROWTH TO DATE CULTURE WILL BE HELD FOR 5 DAYS BEFORE ISSUING A FINAL NEGATIVE REPORT Performed at [facility]    Report Status PENDING  Incomplete  Culture, blood (routine x 2)     Status: None (Preliminary result)   Collection Time: 05/18/15  4:25 PM  Result Value Ref Range Status   Specimen Description BLOOD LEFT ARM  Final   Special Requests   Final    BOTTLES DRAWN AEROBIC AND ANAEROBIC 5CC BOTH BOTTLES   Culture   Final           BLOOD CULTURE RECEIVED NO GROWTH TO DATE CULTURE WILL BE HELD FOR 5 DAYS BEFORE ISSUING A FINAL NEGATIVE REPORT Performed at [facility]    Report Status PENDING  Incomplete  Respiratory virus panel     Status: None   Collection Time: 05/20/15  4:57 PM  Result Value Ref Range Status   Respiratory Syncytial Virus A Negative Negative Final   Respiratory Syncytial Virus B Negative Negative Final   Influenza A Negative Negative Final   Influenza B Negative Negative Final   Parainfluenza 1 Negative Negative Final    Parainfluenza 2 Negative Negative Final   Parainfluenza 3 Negative Negative Final   Metapneumovirus Negative Negative Final   Rhinovirus Negative Negative Final   Adenovirus Negative Negative Final    Comment: (NOTE) Performed At: [facility] [address] [place], [place] [id number] Lindon Romp MD NW:[id number]      Studies: Dg Chest Port 1 View  05/22/2015   CLINICAL DATA:  Community-acquired pneumonia  EXAM: PORTABLE CHEST - 1 VIEW  COMPARISON:  PA and lateral chest x-ray of May 18, 2015  FINDINGS: The lungs are adequately inflated. There is increased density at both bases consistent with  atelectasis or developing alveolar pneumonia. A small amount of pleural fluid on the right is present. The cardiac silhouette is enlarged. The pulmonary vascularity is not engorged. The trachea is midline. The bony thorax is unremarkable. There surgical clips in the left axillary region. The bony thorax is unremarkable.  IMPRESSION: Increasing density at the lung bases is consistent with progressive atelectasis or pneumonia. A small right pleural effusion is present. Stable cardiomegaly without pulmonary vascular congestion.   Electronically Signed   By: David  Martinique M.D.   On: 05/22/2015 07:38    Scheduled Meds: . amLODipine  5 mg Oral Daily  . aspirin EC  81 mg Oral Daily  . enoxaparin (LOVENOX) injection  40 mg Subcutaneous Q24H  . [START ON 05/24/2015] feeding supplement (GLUCERNA SHAKE)  237 mL Oral q morning - 10a  . gabapentin  100 mg Oral QHS  . insulin aspart  0-9 Units Subcutaneous TID WC  . insulin detemir  6 Units Subcutaneous QHS  . levothyroxine  112 mcg Oral QAC breakfast  . linagliptin  5 mg Oral Daily  . metoprolol succinate  50 mg Oral BID  . pantoprazole  40 mg Oral Daily  . piperacillin-tazobactam (ZOSYN)  IV  3.375 g Intravenous Q8H  . polyethylene glycol  17 g Oral Daily  . trandolapril  8 mg Oral Daily  . vancomycin  500 mg Intravenous Q12H   Continuous  Infusions:   Active Problems:   Pneumonia   CAP (community acquired pneumonia)   Acute respiratory failure with hypoxia   Diabetes mellitus without complication   Hypertension   Amiir Heckard, [facility] Pager [phone]. If 7PM-7AM, please contact night-coverage at [email or web address], password [facility] 05/23/2015, 5:10 PM  LOS: 5 days

## 2015-05-23 NOTE — Progress Notes (Signed)
RN spoke with patient's PCP to verify and double check all medications and dosages.

## 2015-05-23 NOTE — Progress Notes (Signed)
Nutrition Follow-up  DOCUMENTATION CODES:  Not applicable  INTERVENTION: - Will decrease Glucerna to once/day - Encourage PO intake - Will provide pt with dessert-type items once/day - RD to continue to monitor for needs  NUTRITION DIAGNOSIS:  Inadequate oral intake related to acute illness as evidenced by per patient/family report. -ongoing  GOAL:  Patient will meet greater than or equal to 90% of their needs -unmet  MONITOR:  PO intake, Supplement acceptance, Weight trends, Labs, I & O's  ASSESSMENT: 81 y.o. female with prior h/o pneumonia, hypertension and DM presents to ED with worsening sob assocaited with productive cough,low grade fevers and myalgias.  Pt eating 35-75% since assessment 5/30. She reports that she has been having ongoing, consistent nausea which has caused a decrease in her appetite. Visualized lunch tray with a few bites of sandwich taken. Pt reports she has requested items such as banana pudding and angel food cake but is told she is unable to receive them. Will put these items in as snacks for pt as she is not eating well and CBGs WDL mainly (70-180 mg/dl).   Pt not meeting needs. Daughter reports pt drinking more fluids here than at home. Will decrease Glucerna to once/day as pt does not like them and is not drinking them often. Medications reviewed. Labs reviewed; CBGs: 97-239 mg/dL, Cl: 99 mmol/L.  Height:  Ht Readings from Last 1 Encounters:  05/18/15 5\' 5"  (1.651 m)    Weight:  Wt Readings from Last 1 Encounters:  05/18/15 140 lb 4.8 oz (63.64 kg)    Ideal Body Weight:  56.8 kg (kg)  Wt Readings from Last 10 Encounters:  05/18/15 140 lb 4.8 oz (63.64 kg)  11/20/14 143 lb 8 oz (65.091 kg)  12/23/12 155 lb (70.308 kg)    BMI:  Body mass index is 23.35 kg/(m^2).  Estimated Nutritional Needs:  Kcal:  1300-1500  Protein:  60-75 grams  Fluid:  2-2.2 L/day  Skin:  Reviewed, no issues  Diet Order:  Diet Carb Modified Fluid  consistency:: Thin; Room service appropriate?: Yes  EDUCATION NEEDS:  No education needs identified at this time   Intake/Output Summary (Last 24 hours) at 05/23/15 1336 Last data filed at 05/23/15 0938  Gross per 24 hour  Intake    570 ml  Output      0 ml  Net    570 ml    Last BM:  5/29   Jarome Matin, RD, LDN Inpatient Clinical Dietitian Pager # [phone] After hours/weekend pager # [phone]

## 2015-05-24 LAB — CBC
HEMATOCRIT: 38.2 % (ref 36.0–46.0)
Hemoglobin: 12.1 g/dL (ref 12.0–15.0)
MCH: 30.4 pg (ref 26.0–34.0)
MCHC: 31.7 g/dL (ref 30.0–36.0)
MCV: 96 fL (ref 78.0–100.0)
Platelets: 163 10*3/uL (ref 150–400)
RBC: 3.98 MIL/uL (ref 3.87–5.11)
RDW: 14 % (ref 11.5–15.5)
WBC: 9.6 10*3/uL (ref 4.0–10.5)

## 2015-05-24 LAB — BASIC METABOLIC PANEL
ANION GAP: 7 (ref 5–15)
BUN: 22 mg/dL — AB (ref 6–20)
CALCIUM: 8.8 mg/dL — AB (ref 8.9–10.3)
CO2: 35 mmol/L — ABNORMAL HIGH (ref 22–32)
Chloride: 98 mmol/L — ABNORMAL LOW (ref 101–111)
Creatinine, Ser: 0.77 mg/dL (ref 0.44–1.00)
GFR calc Af Amer: >60 mL/min (ref >60)
Glucose, Bld: 81 mg/dL (ref 65–99)
Potassium: 3.3 mmol/L — ABNORMAL LOW (ref 3.5–5.1)
SODIUM: 140 mmol/L (ref 135–145)

## 2015-05-24 LAB — CULTURE, BLOOD (ROUTINE X 2)
CULTURE: NO GROWTH
Culture: NO GROWTH

## 2015-05-24 LAB — GLUCOSE, CAPILLARY
Glucose-Capillary: 91 mg/dL (ref 65–99)
Glucose-Capillary: 138 mg/dL — ABNORMAL HIGH (ref 65–99)

## 2015-05-24 MED ORDER — POTASSIUM CHLORIDE CRYS ER 20 MEQ PO TBCR: 40.0000 meq | EXTENDED_RELEASE_TABLET | Freq: Once | ORAL | Status: DC

## 2015-05-24 MED ORDER — AMOXICILLIN-POT CLAVULANATE 875-125 MG PO TABS: 1.0000 | ORAL_TABLET | Freq: Two times a day (BID) | ORAL | Status: AC

## 2015-05-24 NOTE — Care Management Note (Signed)
Case Management Note  Patient Details  Name: Lori Schwartz MRN: [id number] Date of Birth: [birth year]  Subjective/Objective:      81 yo female admitted with pna              Action/Plan: [facility] rep, aware of referral, and patient's daughter stated that she has already received a call from [facility] planning on a follow up Sunday visit. Spoke with Pura Spice, [facility] DME, and the patient and daughter have agreed to pay for the shower chair upon delivery. The patient also needs a portable tank for the daughter to take the patient home. Pura Spice is aware and will deliver tank to the room for dc.  Expected Discharge Date:  05/22/15               Expected Discharge Plan:  [place]  In-House Referral:     Discharge planning Services  CM Consult  Post Acute Care Choice:  Home Health, Durable Medical Equipment Choice offered to:  Patient, Adult Children  DME Arranged:  Shower stool DME Agency:  [place] Arranged:  RN, PT, Nurse's Aide [facility] Agency:  [place]  Status of Service:  In process, will continue to follow  Medicare Important Message Given:  Yes Date Medicare IM Given:  05/21/15 Medicare IM give by:  Leanne Chang Date Additional Medicare IM Given:    Additional Medicare Important Message give by:     If discussed at [place] of Stay Meetings, dates discussed:    Additional Comments:  Scot Dock, RN 05/24/2015, 2:43 PM

## 2015-05-24 NOTE — Discharge Summary (Addendum)
Physician Discharge Summary  Lori Schwartz:[id number] DOB: 15-Oct-1925 DOA: 05/18/2015  PCP: Mathews Argyle, MD  Admit date: 05/18/2015 Discharge date: 05/25/2015  Recommendations for Outpatient Follow-up:  1. PCP in approximately 3 weeks for repeat BMP to check potassium and repeat chest x-ray 2. Home health physical, occupational therapy and RN  Discharge Diagnoses:  Active Problems:   Pneumonia   CAP (community acquired pneumonia)   Acute respiratory failure with hypoxia   Diabetes mellitus without complication   Hypertension   Discharge Condition: Stable, improved  Diet recommendation: Diabetic  Wt Readings from Last 3 Encounters:  05/18/15 63.64 kg (140 lb 4.8 oz)  11/20/14 65.091 kg (143 lb 8 oz)  12/23/12 70.308 kg (155 lb)    History of present illness:  The patient is an 81 year old female with history of diabetes, hypertension who failed outpatient treatment for pneumonia with levofloxacin and ciprofloxacin. She presented to the emergency department with hypoxia and chest x-ray demonstrated a left lower lobe pneumonia.   Hospital Course:   Acute hypoxic respiratory failure and sepsis secondary to community-acquired pneumonia present at time of admission. There was also concern that she may have recurrent aspiration because of a history of choking when eating but could be causing her to have nonresponsive pneumonia. She was started on vancomycin and Zosyn. Her strep pneumo, Legionella, respiratory viral panel, and HIV tests were all negative. She was transitioned from 3 L nasal cannula to nasal cannula only needed with exertion, 2 L. She already has home oxygen.  She has been given a prescription for Augmentin to continue at home for the next 3 days. Blood cultures are so far no growth to date. She underwent a speech evaluation and did initially report some choking with meals but later denied any problems with her swallowing. They recommended that she continue a  regular diet with thin liquids. She was given a flutter valve and incentive spirometer to continue to use at home.  Generalized weakness secondary to acute illness. She was seen by physical and occupational therapy who recommended home health services which have been ordered.  Diabetes mellitus type 2, blood sugars were well controlled with sliding scale insulin during hospitalization and she may resume her home medications at discharge.  Essential hypertension, her blood pressure remained stable on her home medications.  Leukocytosis likely secondary to sepsis and community-acquired pneumonia and resolved with IV fluids and anti-biotics.  Bilateral lower extremity edema secondary to venous stasis. She had a 2-D echocardiogram which demonstrated an ejection fraction of 65-70% and grade 1 diastolic dysfunction with septal motion dyssynergy. She is at risk for chronic diastolic heart failure. She should continue to eat her low sodium diet and report any increased swelling to her primary care doctor.  Nausea with suspected constipation. She had an abdominal x-ray which demonstrated no obvious obstruction and her constipation resolved with laxatives.   Consultants:  none  Procedures:  none  Antibiotics:  Vancomycin 5/28>>>5/31, resumed 6/1  Zosyn 5/28>>>5/31, resumed 6/1  Azithromycin 5/31>>> 6/1  Rocephin 5/31>>> 6/1  Discharge Exam: Filed Vitals:   05/24/15 0555  BP: 146/66  Pulse: 62  Temp: 97.8 F (36.6 C)  Resp: 20   Filed Vitals:   05/23/15 0605 05/23/15 1408 05/24/15 0555 05/24/15 1329  BP: 158/71 115/57 146/66   Pulse: 72 73 62   Temp: 97.7 F (36.5 C) 97.6 F (36.4 C) 97.8 F (36.6 C)   TempSrc: Oral Oral Oral   Resp: 18 20 20    Height:  Weight:      SpO2: 94% 90% 97% 89%     General: Awake, alert, well-appearing, no acute distress  Cardiovascular: regular, s1, s2  Respiratory: diminished at right base with course rales, + rhonchi,, no  wheezing  Abdomen: soft, pos BS, mildly distended  Musculoskeletal: perfused, no clubbing, no LEE  Discharge Instructions      Discharge Instructions    Call MD for:  difficulty breathing, headache or visual disturbances    Complete by:  As directed      Call MD for:  extreme fatigue    Complete by:  As directed      Call MD for:  hives    Complete by:  As directed      Call MD for:  persistant dizziness or light-headedness    Complete by:  As directed      Call MD for:  temperature >100.4    Complete by:  As directed      Diet Carb Modified    Complete by:  As directed      Discharge instructions    Complete by:  As directed   You were hospitalized with pneumonia.  Please take augmentin for three more days, your next dose is due tonight.  If you have worsening shortness of breath or low oxygen levels, high fevers, chills, or other signs of worsening illness, please seek immediate medical attention.     Increase activity slowly    Complete by:  As directed             Medication List    STOP taking these medications        benzonatate 200 MG capsule  Commonly known as:  TESSALON     HYDROcodone-acetaminophen 5-325 MG per tablet  Commonly known as:  NORCO/VICODIN     levofloxacin 500 MG tablet  Commonly known as:  LEVAQUIN     traZODone 50 MG tablet  Commonly known as:  DESYREL      TAKE these medications        acetaminophen 325 MG tablet  Commonly known as:  TYLENOL  Take 650 mg by mouth every 6 (six) hours as needed for moderate pain or headache.     amLODipine 5 MG tablet  Commonly known as:  NORVASC  Take 5 mg by mouth daily.     amoxicillin-clavulanate 875-125 MG per tablet  Commonly known as:  AUGMENTIN  Take 1 tablet by mouth 2 (two) times daily.     aspirin EC 81 MG tablet  Take 81 mg by mouth daily.     calcium-vitamin D 500-200 MG-UNIT per tablet  Commonly known as:  OSCAL WITH D  Take 1 tablet by mouth daily.     gabapentin 100 MG  capsule  Commonly known as:  NEURONTIN  Take 100 mg by mouth at bedtime.     glipiZIDE 10 MG 24 hr tablet  Commonly known as:  GLUCOTROL XL  Take 20 mg by mouth daily.     hydrochlorothiazide 12.5 MG capsule  Commonly known as:  MICROZIDE  Take 12.5 mg by mouth daily.     insulin detemir 100 UNIT/ML injection  Commonly known as:  LEVEMIR  Inject 6 Units into the skin at bedtime.     levothyroxine 112 MCG tablet  Commonly known as:  SYNTHROID, LEVOTHROID  Take 112 mcg by mouth daily.     linagliptin 5 MG Tabs tablet  Commonly known as:  TRADJENTA  Take 5 mg by  mouth daily.     metoprolol succinate 50 MG 24 hr tablet  Commonly known as:  TOPROL-XL  Take 50 mg by mouth 2 (two) times daily. Take with or immediately following a meal.     multivitamin with minerals Tabs tablet  Take 1 tablet by mouth daily.     omeprazole 20 MG capsule  Commonly known as:  PRILOSEC  Take 20 mg by mouth daily.     senna 8.6 MG tablet  Commonly known as:  SENOKOT  Take 1 tablet by mouth daily as needed for constipation.     sodium chloride 0.65 % Soln nasal spray  Commonly known as:  OCEAN  Place 1 spray into both nostrils 3 (three) times daily.     traMADol 50 MG tablet  Commonly known as:  ULTRAM  Take 50 mg by mouth every 4 (four) hours as needed for moderate pain.     trandolapril 4 MG tablet  Commonly known as:  MAVIK  Take 8 mg by mouth daily.       Follow-up Information    Follow up with Mathews Argyle, MD. Schedule an appointment as soon as possible for a visit in 2 weeks.   Specialty:  Internal Medicine   Contact information:   [address] [place] [address] [phone]        The results of significant diagnostics from this hospitalization (including imaging, microbiology, ancillary and laboratory) are listed below for reference.    Significant Diagnostic Studies: Dg Chest 2 View (if Patient Has Fever And/or Copd)  05/18/2015   CLINICAL  DATA:  Cough shortness of breath central chest tightness for 3 weeks  EXAM: CHEST  2 VIEW  COMPARISON:  05/06/2015  FINDINGS: Mild left-sided cardiac enlargement stable. Vascular pattern normal. Mild scarring or atelectasis right lung base with opacity medial left lung base which could represent pneumonia scarring or atelectasis. Left base opacity slightly more prominent when compared to 05/06/2015.  Uncoiling and calcification of the aorta stable. No pleural effusion or pneumothorax. No consolidation or effusion. Hazy soft tissue attenuation over both mid to lower lung zones identical to prior study which appears to be due to breast augmentation.  IMPRESSION: Infiltrate medial left lower lobe. This could represent atelectasis or pneumonia.   Electronically Signed   By: Skipper Cliche M.D.   On: 05/18/2015 15:33   Dg Chest 2 View  05/06/2015   CLINICAL DATA:  Kohl Polinsky of breath  EXAM: CHEST  2 VIEW  COMPARISON:  01/07/2015  FINDINGS: Heart size upper normal. Aorta is uncoiled. Negative for heart failure.  Right lower lobe and right middle lobe airspace disease may represent atelectasis or pneumonia. This area was clear previously. No effusion or mass lesion  Bilateral breast reconstruction.  IMPRESSION: Right lower lobe and right middle lobe airspace disease, atelectasis or pneumonia.   Electronically Signed   By: Franchot Gallo M.D.   On: 05/06/2015 17:00   Dg Chest Port 1 View  05/22/2015   CLINICAL DATA:  Community-acquired pneumonia  EXAM: PORTABLE CHEST - 1 VIEW  COMPARISON:  PA and lateral chest x-ray of May 18, 2015  FINDINGS: The lungs are adequately inflated. There is increased density at both bases consistent with atelectasis or developing alveolar pneumonia. A small amount of pleural fluid on the right is present. The cardiac silhouette is enlarged. The pulmonary vascularity is not engorged. The trachea is midline. The bony thorax is unremarkable. There surgical clips in the left axillary region.  The  bony thorax is unremarkable.  IMPRESSION: Increasing density at the lung bases is consistent with progressive atelectasis or pneumonia. A small right pleural effusion is present. Stable cardiomegaly without pulmonary vascular congestion.   Electronically Signed   By: David  Martinique M.D.   On: 05/22/2015 07:38   Dg Abd Portable 1v  05/21/2015   CLINICAL DATA:  Abdominal pain and constipation  EXAM: PORTABLE ABDOMEN - 1 VIEW  COMPARISON:  01/06/2009  FINDINGS: Scattered large and small bowel gas is noted. No obstructive changes are seen. Mild fecal material is noted within the colon. No findings to suggest impaction are seen. Stable scoliosis of the lumbar spine is noted. Diffuse aortic calcifications are noted without definitive aneurysm.  IMPRESSION: Nonspecific abdomen.   Electronically Signed   By: Inez Catalina M.D.   On: 05/21/2015 15:16    Microbiology: Recent Results (from the past 240 hour(s))  Culture, blood (routine x 2)     Status: None   Collection Time: 05/18/15  4:21 PM  Result Value Ref Range Status   Specimen Description BLOOD RIGHT ARM  Final   Special Requests   Final    BOTTLES DRAWN AEROBIC AND ANAEROBIC 5CC BOTH BOTTLES   Culture   Final    NO GROWTH 5 DAYS Performed at [facility]    Report Status 05/24/2015 FINAL  Final  Culture, blood (routine x 2)     Status: None   Collection Time: 05/18/15  4:25 PM  Result Value Ref Range Status   Specimen Description BLOOD LEFT ARM  Final   Special Requests   Final    BOTTLES DRAWN AEROBIC AND ANAEROBIC 5CC BOTH BOTTLES   Culture   Final    NO GROWTH 5 DAYS Performed at [facility]    Report Status 05/24/2015 FINAL  Final  Respiratory virus panel     Status: None   Collection Time: 05/20/15  4:57 PM  Result Value Ref Range Status   Respiratory Syncytial Virus A Negative Negative Final   Respiratory Syncytial Virus B Negative Negative Final   Influenza A Negative Negative Final   Influenza B Negative  Negative Final   Parainfluenza 1 Negative Negative Final   Parainfluenza 2 Negative Negative Final   Parainfluenza 3 Negative Negative Final   Metapneumovirus Negative Negative Final   Rhinovirus Negative Negative Final   Adenovirus Negative Negative Final    Comment: (NOTE) Performed At: [facility] [address] [place], [place] [id number] Lindon Romp MD NW:[id number]      Labs: Basic Metabolic Panel:  Recent Labs Lab 05/20/15 0515 05/21/15 0508 05/22/15 0520 05/23/15 0525 05/24/15 0525  NA 139 143 139 141 140  K 3.8 3.5 3.4* 3.7 3.3*  CL 97* 99* 97* 99* 98*  CO2 35* 35* 34* 36* 35*  GLUCOSE 98 95 90 80 81  BUN 23* 15 15 22* 22*  CREATININE 0.57 0.50 0.72 0.89 0.77  CALCIUM 8.7* 8.8* 9.0 9.0 8.8*   Liver Function Tests: No results for input(s): AST, ALT, ALKPHOS, BILITOT, PROT, ALBUMIN in the last 168 hours. No results for input(s): LIPASE, AMYLASE in the last 168 hours. No results for input(s): AMMONIA in the last 168 hours. CBC:  Recent Labs Lab 05/20/15 0515 05/21/15 0508 05/22/15 0520 05/23/15 0525 05/24/15 0525  WBC 9.1 11.5* 9.9 10.0 9.6  HGB 11.9* 12.7 13.0 12.8 12.1  HCT 36.7 39.3 40.2 40.6 38.2  MCV 92.0 92.5 94.6 96.4 96.0  PLT 184 181 178  169 163   Cardiac Enzymes: No results for input(s): CKTOTAL, CKMB, CKMBINDEX, TROPONINI in the last 168 hours. BNP: BNP (last 3 results)  Recent Labs  05/18/15 1500  BNP 34.1    ProBNP (last 3 results) No results for input(s): PROBNP in the last 8760 hours.  CBG:  Recent Labs Lab 05/23/15 1206 05/23/15 1713 05/23/15 2140 05/24/15 0725 05/24/15 1147  GLUCAP 147* 204* 191* 91 138*    Time coordinating discharge: 35 minutes  Signed:  Ferrel Simington  [facility] 05/25/2015, 7:54 PM

## 2015-05-24 NOTE — Progress Notes (Signed)
Discharge instructions given to pt/family, verbalized understanding. Left the unit in stable condition. 

## 2015-05-31 ENCOUNTER — Other Ambulatory Visit: Payer: Self-pay | Admitting: Geriatric Medicine

## 2015-05-31 ENCOUNTER — Ambulatory Visit
Admission: RE | Admit: 2015-05-31 | Discharge: 2015-05-31 | Disposition: A | Discharge status: Home / Self Care | Payer: Medicare Other | Source: Ambulatory Visit | Attending: Geriatric Medicine | Admitting: Geriatric Medicine

## 2015-05-31 DIAGNOSIS — J189 Pneumonia, unspecified organism: Secondary | ICD-10-CM

## 2015-08-05 NOTE — Progress Notes (Signed)
HPI: 81 year old female for evaluation of chest pain. Cardiac catheterization 2010 showed no obstructive disease; ejection fraction 40% and findings consistent with takotsubo. Echocardiogram May 2016 showed vigorous LV function, mild left ventricular hypertrophy, grade 1 diastolic dysfunction, trace mitral regurgitation.  Current Outpatient Prescriptions  Medication Sig Dispense Refill  . acetaminophen (TYLENOL) 325 MG tablet Take 650 mg by mouth every 6 (six) hours as needed for moderate pain or headache.    Marland Kitchen amLODipine (NORVASC) 5 MG tablet Take 5 mg by mouth daily.    Marland Kitchen amoxicillin-clavulanate (AUGMENTIN) 875-125 MG per tablet Take 1 tablet by mouth 2 (two) times daily. 6 tablet 0  . aspirin EC 81 MG tablet Take 81 mg by mouth daily.    . calcium-vitamin D (OSCAL WITH D) 500-200 MG-UNIT per tablet Take 1 tablet by mouth daily.    Marland Kitchen gabapentin (NEURONTIN) 100 MG capsule Take 100 mg by mouth at bedtime.    Marland Kitchen glipiZIDE (GLUCOTROL XL) 10 MG 24 hr tablet Take 20 mg by mouth daily.    . hydrochlorothiazide (MICROZIDE) 12.5 MG capsule Take 12.5 mg by mouth daily.    . insulin detemir (LEVEMIR) 100 UNIT/ML injection Inject 6 Units into the skin at bedtime.    Marland Kitchen levothyroxine (SYNTHROID, LEVOTHROID) 112 MCG tablet Take 112 mcg by mouth daily.    Marland Kitchen linagliptin (TRADJENTA) 5 MG TABS tablet Take 5 mg by mouth daily.    . metoprolol succinate (TOPROL-XL) 50 MG 24 hr tablet Take 50 mg by mouth 2 (two) times daily. Take with or immediately following a meal.    . Multiple Vitamin (MULTIVITAMIN WITH MINERALS) TABS tablet Take 1 tablet by mouth daily.    Marland Kitchen omeprazole (PRILOSEC) 20 MG capsule Take 20 mg by mouth daily.    Marland Kitchen senna (SENOKOT) 8.6 MG tablet Take 1 tablet by mouth daily as needed for constipation.    . sodium chloride (OCEAN) 0.65 % SOLN nasal spray Place 1 spray into both nostrils 3 (three) times daily.    . traMADol (ULTRAM) 50 MG tablet Take 50 mg by mouth every 4 (four) hours as needed  for moderate pain.     . trandolapril (MAVIK) 4 MG tablet Take 8 mg by mouth daily.     No current facility-administered medications for this visit.    Allergies  Allergen Reactions  . Codeine Nausea And Vomiting  . Statins     Muscle pain   . Latex Itching and Rash    Past Medical History  Diagnosis Date  . Diabetes mellitus without complication   . Hypertension   . Thyroid disease   . Cancer     breast  . Acute MI 2011?  Marland Kitchen Hyperlipidemia   . Arthritis   . GERD (gastroesophageal reflux disease)     Past Surgical History  Procedure Laterality Date  . Breast surgery      L mastectomy   . Thyroid surgery    . Appendectomy    . Mastectomy      left    Social History   Social History  . Marital Status: Widowed    Spouse Name: N/A  . Number of Children: N/A  . Years of Education: N/A   Occupational History  . Not on file.   Social History Main Topics  . Smoking status: Never Smoker   . Smokeless tobacco: Not on file  . Alcohol Use: No  . Drug Use: No  . Sexual Activity: No   Other Topics  Concern  . Not on file   Social History Narrative    No family history on file.  ROS: no fevers or chills, productive cough, hemoptysis, dysphasia, odynophagia, melena, hematochezia, dysuria, hematuria, rash, seizure activity, orthopnea, PND, pedal edema, claudication. Remaining systems are negative.  Physical Exam:   There were no vitals taken for this visit.  General:  Well developed/well nourished in NAD Skin warm/dry Patient not depressed No peripheral clubbing Back-normal HEENT-normal/normal eyelids Neck supple/normal carotid upstroke bilaterally; no bruits; no JVD; no thyromegaly chest - CTA/ normal expansion CV - RRR/normal S1 and S2; no murmurs, rubs or gallops;  PMI nondisplaced Abdomen -NT/ND, no HSM, no mass, + bowel sounds, no bruit 2+ femoral pulses, no bruits Ext-no edema, chords, 2+ DP Neuro-grossly nonfocal  ECG 05/18/2015-sinus rhythm,  left anterior fascicular block, IVCD, cannot rule out prior anterior infarct.   This encounter was created in error - please disregard.

## 2015-08-08 ENCOUNTER — Encounter: Payer: Medicare Other | Admitting: Cardiology

## 2015-12-05 IMAGING — DX DG ABD PORTABLE 1V
1 series · 1 of 1 positions shown · non-contrast
Comparison: 01/06/2009

CLINICAL DATA: Abdominal pain and constipation

EXAM:
PORTABLE ABDOMEN - 1 VIEW

[abdomen kub]
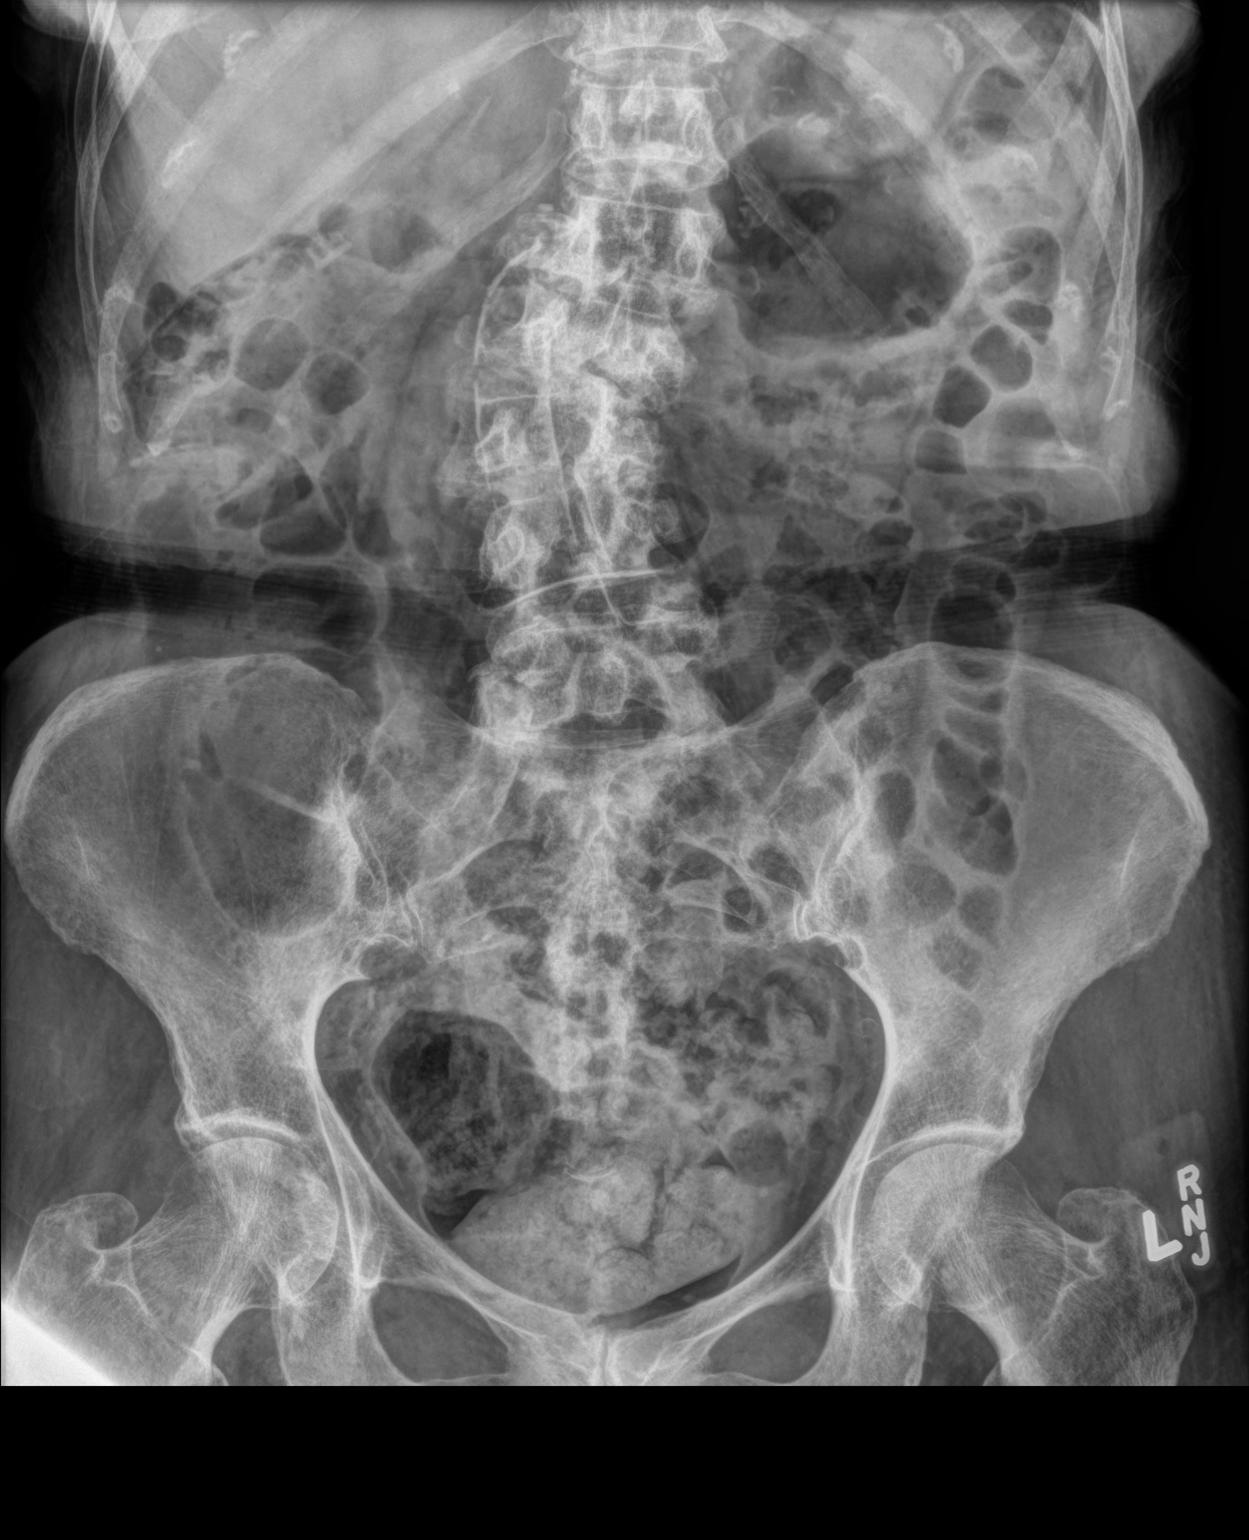

[1 of 1 positions shown; findings below may reference images not displayed]

FINDINGS: Scattered large and small bowel gas is noted. No obstructive changes
are seen. Mild fecal material is noted within the colon. No findings
to suggest impaction are seen. Stable scoliosis of the lumbar spine
is noted. Diffuse aortic calcifications are noted without definitive
aneurysm.
IMPRESSION: Nonspecific abdomen.

## 2015-12-06 IMAGING — DX DG CHEST 1V PORT
1 series · 1 of 1 positions shown · non-contrast
Comparison: PA and lateral chest x-ray May 18, 2015

CLINICAL DATA: Community-acquired pneumonia

EXAM:
PORTABLE CHEST - 1 VIEW

[chest ap]
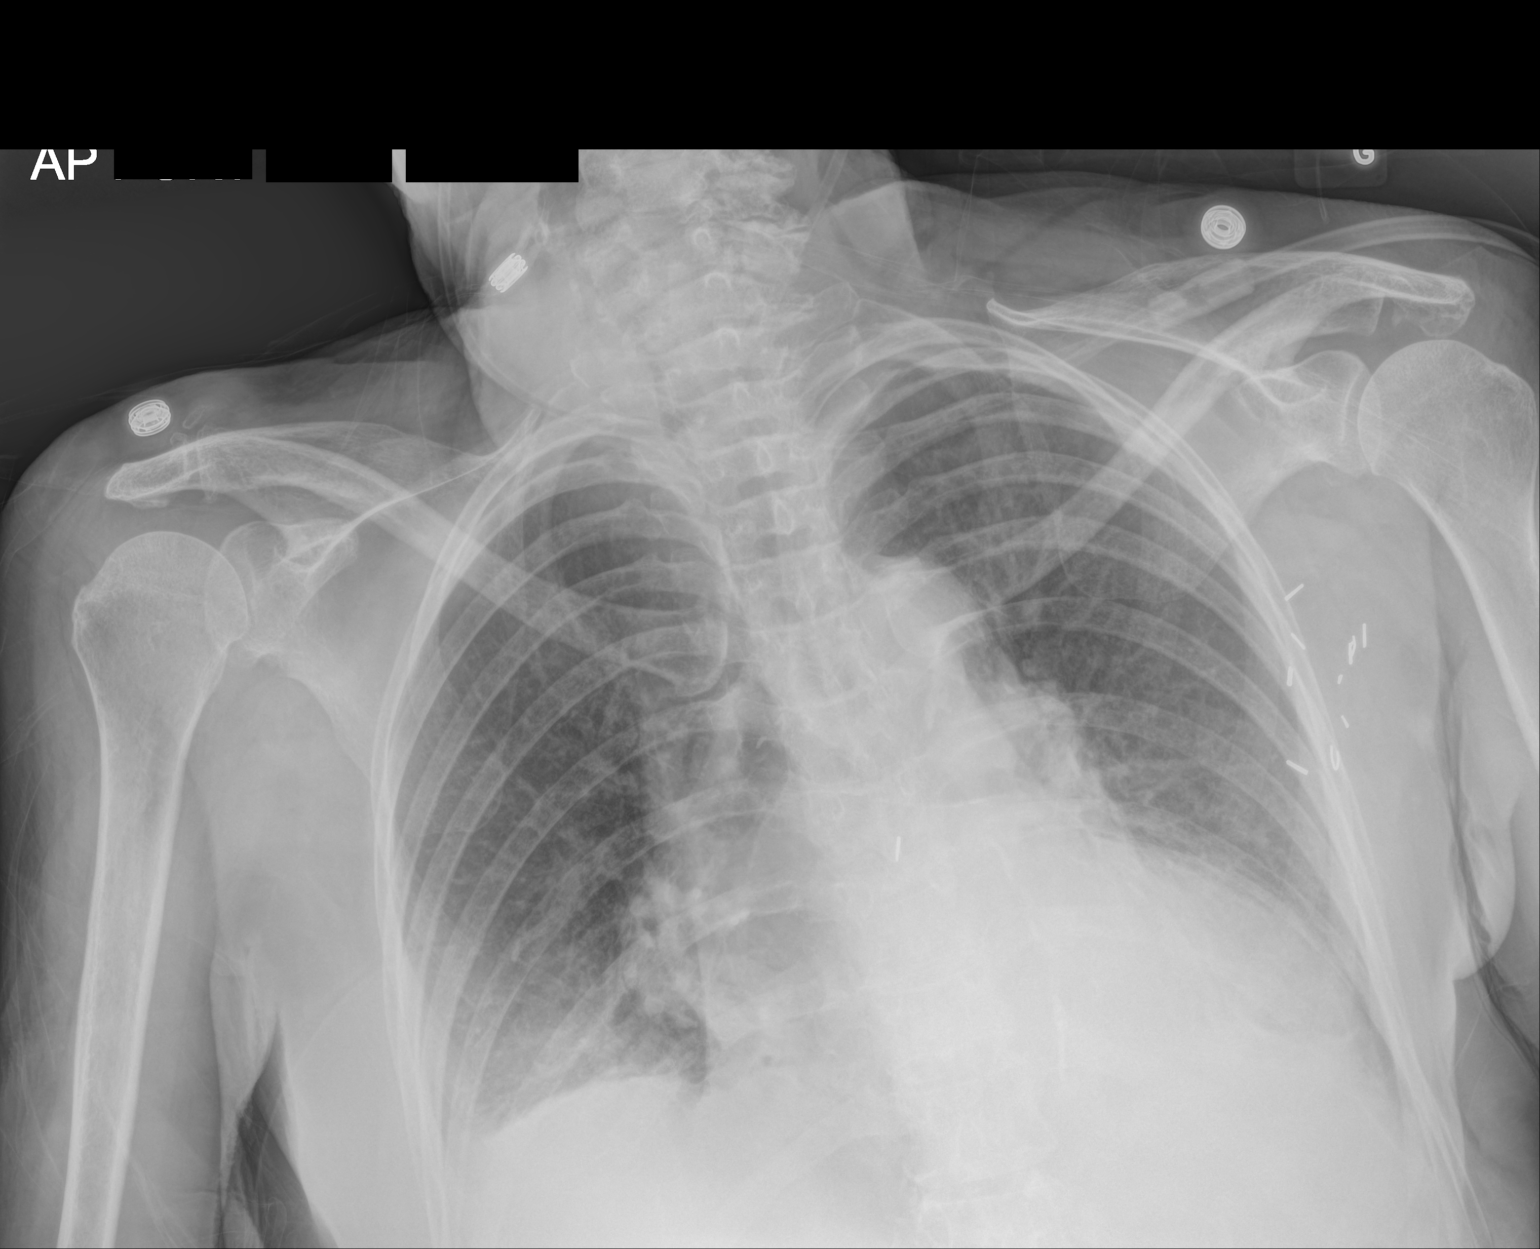

[1 of 1 positions shown; findings below may reference images not displayed]

FINDINGS: The lungs are adequately inflated. There is increased density at
both bases consistent with atelectasis or developing alveolar
pneumonia. A small amount of pleural fluid on the right is present.
The cardiac silhouette is enlarged. The pulmonary vascularity is not
engorged. The trachea is midline. The bony thorax is unremarkable.
There surgical clips in the left axillary region. The bony thorax is
unremarkable.
IMPRESSION: Increasing density at the lung bases is consistent with progressive
atelectasis or pneumonia. A small right pleural effusion is present.
Stable cardiomegaly without pulmonary vascular congestion.

## 2016-03-18 ENCOUNTER — Encounter (HOSPITAL_COMMUNITY): Payer: Self-pay | Admitting: *Deleted

## 2016-03-18 ENCOUNTER — Emergency Department (HOSPITAL_COMMUNITY): Payer: Medicare Other

## 2016-03-18 ENCOUNTER — Inpatient Hospital Stay (HOSPITAL_COMMUNITY)
Admission: EM | Admit: 2016-03-18 | Discharge: 2016-03-21 | DRG: 193 | Disposition: E | Payer: Medicare Other | Attending: Family Medicine | Admitting: Family Medicine

## 2016-03-18 DIAGNOSIS — J189 Pneumonia, unspecified organism: Secondary | ICD-10-CM | POA: Diagnosis present

## 2016-03-18 DIAGNOSIS — R1310 Dysphagia, unspecified: Secondary | ICD-10-CM | POA: Diagnosis present

## 2016-03-18 DIAGNOSIS — J9601 Acute respiratory failure with hypoxia: Secondary | ICD-10-CM | POA: Diagnosis present

## 2016-03-18 DIAGNOSIS — I110 Hypertensive heart disease with heart failure: Secondary | ICD-10-CM | POA: Diagnosis present

## 2016-03-18 DIAGNOSIS — Z79899 Other long term (current) drug therapy: Secondary | ICD-10-CM | POA: Diagnosis not present

## 2016-03-18 DIAGNOSIS — R778 Other specified abnormalities of plasma proteins: Secondary | ICD-10-CM

## 2016-03-18 DIAGNOSIS — I4891 Unspecified atrial fibrillation: Secondary | ICD-10-CM

## 2016-03-18 DIAGNOSIS — E039 Hypothyroidism, unspecified: Secondary | ICD-10-CM | POA: Diagnosis present

## 2016-03-18 DIAGNOSIS — I482 Chronic atrial fibrillation: Secondary | ICD-10-CM | POA: Diagnosis present

## 2016-03-18 DIAGNOSIS — E876 Hypokalemia: Secondary | ICD-10-CM

## 2016-03-18 DIAGNOSIS — Z515 Encounter for palliative care: Secondary | ICD-10-CM | POA: Diagnosis present

## 2016-03-18 DIAGNOSIS — G9341 Metabolic encephalopathy: Secondary | ICD-10-CM | POA: Diagnosis present

## 2016-03-18 DIAGNOSIS — I480 Paroxysmal atrial fibrillation: Secondary | ICD-10-CM | POA: Diagnosis not present

## 2016-03-18 DIAGNOSIS — J1000 Influenza due to other identified influenza virus with unspecified type of pneumonia: Secondary | ICD-10-CM | POA: Diagnosis present

## 2016-03-18 DIAGNOSIS — R7989 Other specified abnormal findings of blood chemistry: Secondary | ICD-10-CM

## 2016-03-18 DIAGNOSIS — Z885 Allergy status to narcotic agent status: Secondary | ICD-10-CM | POA: Diagnosis not present

## 2016-03-18 DIAGNOSIS — Z91040 Latex allergy status: Secondary | ICD-10-CM | POA: Diagnosis not present

## 2016-03-18 DIAGNOSIS — E119 Type 2 diabetes mellitus without complications: Secondary | ICD-10-CM

## 2016-03-18 DIAGNOSIS — I10 Essential (primary) hypertension: Secondary | ICD-10-CM

## 2016-03-18 DIAGNOSIS — M1990 Unspecified osteoarthritis, unspecified site: Secondary | ICD-10-CM | POA: Diagnosis present

## 2016-03-18 DIAGNOSIS — K219 Gastro-esophageal reflux disease without esophagitis: Secondary | ICD-10-CM | POA: Diagnosis present

## 2016-03-18 DIAGNOSIS — R9431 Abnormal electrocardiogram [ECG] [EKG]: Secondary | ICD-10-CM

## 2016-03-18 DIAGNOSIS — E860 Dehydration: Secondary | ICD-10-CM | POA: Diagnosis present

## 2016-03-18 DIAGNOSIS — Z66 Do not resuscitate: Secondary | ICD-10-CM | POA: Diagnosis present

## 2016-03-18 DIAGNOSIS — E871 Hypo-osmolality and hyponatremia: Secondary | ICD-10-CM | POA: Diagnosis present

## 2016-03-18 DIAGNOSIS — I252 Old myocardial infarction: Secondary | ICD-10-CM | POA: Diagnosis not present

## 2016-03-18 DIAGNOSIS — Z853 Personal history of malignant neoplasm of breast: Secondary | ICD-10-CM | POA: Diagnosis not present

## 2016-03-18 DIAGNOSIS — D72829 Elevated white blood cell count, unspecified: Secondary | ICD-10-CM

## 2016-03-18 DIAGNOSIS — E079 Disorder of thyroid, unspecified: Secondary | ICD-10-CM | POA: Diagnosis present

## 2016-03-18 DIAGNOSIS — E785 Hyperlipidemia, unspecified: Secondary | ICD-10-CM | POA: Diagnosis present

## 2016-03-18 DIAGNOSIS — I5020 Unspecified systolic (congestive) heart failure: Secondary | ICD-10-CM | POA: Diagnosis present

## 2016-03-18 DIAGNOSIS — Z7982 Long term (current) use of aspirin: Secondary | ICD-10-CM | POA: Diagnosis not present

## 2016-03-18 DIAGNOSIS — Z794 Long term (current) use of insulin: Secondary | ICD-10-CM | POA: Diagnosis not present

## 2016-03-18 DIAGNOSIS — I447 Left bundle-branch block, unspecified: Secondary | ICD-10-CM | POA: Diagnosis present

## 2016-03-18 DIAGNOSIS — I071 Rheumatic tricuspid insufficiency: Secondary | ICD-10-CM | POA: Diagnosis present

## 2016-03-18 DIAGNOSIS — Z9012 Acquired absence of left breast and nipple: Secondary | ICD-10-CM | POA: Diagnosis not present

## 2016-03-18 HISTORY — DX: Takotsubo syndrome: I51.81

## 2016-03-18 LAB — CBC
HEMATOCRIT: 43.1 % (ref 36.0–46.0)
HEMOGLOBIN: 15.7 g/dL — AB (ref 12.0–15.0)
MCH: 31.2 pg (ref 26.0–34.0)
MCHC: 36.4 g/dL — AB (ref 30.0–36.0)
MCV: 85.5 fL (ref 78.0–100.0)
Platelets: 186 10*3/uL (ref 150–400)
RBC: 5.04 MIL/uL (ref 3.87–5.11)
RDW: 13.7 % (ref 11.5–15.5)
WBC: 15.7 10*3/uL — ABNORMAL HIGH (ref 4.0–10.5)

## 2016-03-18 LAB — URINE MICROSCOPIC-ADD ON

## 2016-03-18 LAB — INFLUENZA PANEL BY PCR (TYPE A & B)
H1N1 flu by pcr: NOT DETECTED
INFLAPCR: NEGATIVE
INFLBPCR: POSITIVE — AB

## 2016-03-18 LAB — URINALYSIS, ROUTINE W REFLEX MICROSCOPIC
Hgb urine dipstick: NEGATIVE
Ketones, ur: >80 mg/dL — AB
Leukocytes, UA: NEGATIVE
NITRITE: NEGATIVE
PH: 6.5 (ref 5.0–8.0)
Protein, ur: 100 mg/dL — AB
SPECIFIC GRAVITY, URINE: 1.023 (ref 1.005–1.030)

## 2016-03-18 LAB — BASIC METABOLIC PANEL
ANION GAP: 12 (ref 5–15)
BUN: 6 mg/dL (ref 6–20)
CALCIUM: 8.8 mg/dL — AB (ref 8.9–10.3)
CO2: 34 mmol/L — ABNORMAL HIGH (ref 22–32)
Chloride: 82 mmol/L — ABNORMAL LOW (ref 101–111)
Creatinine, Ser: 0.64 mg/dL (ref 0.44–1.00)
GLUCOSE: 286 mg/dL — AB (ref 65–99)
POTASSIUM: 2.5 mmol/L — AB (ref 3.5–5.1)
SODIUM: 128 mmol/L — AB (ref 135–145)

## 2016-03-18 LAB — BRAIN NATRIURETIC PEPTIDE: B NATRIURETIC PEPTIDE 5: 103 pg/mL — AB (ref 0.0–100.0)

## 2016-03-18 LAB — I-STAT TROPONIN, ED: Troponin i, poc: 0.01 ng/mL (ref 0.00–0.08)

## 2016-03-18 LAB — TROPONIN I
TROPONIN I: 0.04 ng/mL — AB (ref <0.031)
TROPONIN I: 0.06 ng/mL — AB (ref <0.031)
TROPONIN I: 0.05 ng/mL — AB (ref <0.031)

## 2016-03-18 LAB — CBG MONITORING, ED: Glucose-Capillary: 281 mg/dL — ABNORMAL HIGH (ref 65–99)

## 2016-03-18 LAB — I-STAT CG4 LACTIC ACID, ED: Lactic Acid, Venous: 1.71 mmol/L (ref 0.5–2.0)

## 2016-03-18 LAB — MAGNESIUM: MAGNESIUM: 2.1 mg/dL (ref 1.7–2.4)

## 2016-03-18 MED ORDER — TRAMADOL HCL 50 MG PO TABS
50.0000 mg | ORAL_TABLET | Freq: Four times a day (QID) | ORAL | Status: DC | PRN
  Administered 2016-03-18: 50 mg via ORAL
  Filled 2016-03-18: qty 1

## 2016-03-18 MED ORDER — SALINE SPRAY 0.65 % NA SOLN
1.0000 | Freq: Three times a day (TID) | NASAL | Status: DC
  Administered 2016-03-18: 1 via NASAL
  Filled 2016-03-18: qty 44

## 2016-03-18 MED ORDER — PROCHLORPERAZINE EDISYLATE 5 MG/ML IJ SOLN
2.5000 mg | Freq: Three times a day (TID) | INTRAMUSCULAR | Status: DC | PRN
  Filled 2016-03-18: qty 0.5

## 2016-03-18 MED ORDER — METOPROLOL SUCCINATE ER 50 MG PO TB24
50.0000 mg | ORAL_TABLET | Freq: Two times a day (BID) | ORAL | Status: DC
  Administered 2016-03-18: 50 mg via ORAL
  Filled 2016-03-18: qty 1

## 2016-03-18 MED ORDER — DEXTROSE 5 % IV SOLN
1.0000 g | INTRAVENOUS | Status: DC
  Filled 2016-03-18: qty 10

## 2016-03-18 MED ORDER — SODIUM CHLORIDE 0.9 % IV SOLN
INTRAVENOUS | Status: DC
  Administered 2016-03-18: 18:00:00 via INTRAVENOUS

## 2016-03-18 MED ORDER — ASPIRIN EC 81 MG PO TBEC: 81.0000 mg | DELAYED_RELEASE_TABLET | Freq: Every day | ORAL | Status: DC

## 2016-03-18 MED ORDER — INSULIN ASPART 100 UNIT/ML ~~LOC~~ SOLN
0.0000 [IU] | Freq: Three times a day (TID) | SUBCUTANEOUS | Status: DC
  Administered 2016-03-19: 7 [IU] via SUBCUTANEOUS

## 2016-03-18 MED ORDER — AMLODIPINE BESYLATE 5 MG PO TABS: 5.0000 mg | ORAL_TABLET | Freq: Every day | ORAL | Status: DC

## 2016-03-18 MED ORDER — GABAPENTIN 100 MG PO CAPS
100.0000 mg | ORAL_CAPSULE | Freq: Every day | ORAL | Status: DC
  Administered 2016-03-18: 100 mg via ORAL
  Filled 2016-03-18: qty 1

## 2016-03-18 MED ORDER — ASPIRIN EC 81 MG PO TBEC
81.0000 mg | DELAYED_RELEASE_TABLET | Freq: Every day | ORAL | Status: DC
  Administered 2016-03-18: 81 mg via ORAL
  Filled 2016-03-18: qty 1

## 2016-03-18 MED ORDER — ACETAMINOPHEN 325 MG PO TABS: 650.0000 mg | ORAL_TABLET | Freq: Four times a day (QID) | ORAL | Status: DC | PRN

## 2016-03-18 MED ORDER — ENOXAPARIN SODIUM 60 MG/0.6ML ~~LOC~~ SOLN
1.0000 mg/kg | Freq: Once | SUBCUTANEOUS | Status: DC
  Filled 2016-03-18: qty 0.6

## 2016-03-18 MED ORDER — SENNA 8.6 MG PO TABS: 8.6000 mg | ORAL_TABLET | Freq: Every day | ORAL | Status: DC | PRN

## 2016-03-18 MED ORDER — ENOXAPARIN SODIUM 60 MG/0.6ML ~~LOC~~ SOLN
1.0000 mg/kg | Freq: Two times a day (BID) | SUBCUTANEOUS | Status: DC
  Administered 2016-03-19: 60 mg via SUBCUTANEOUS
  Filled 2016-03-18: qty 0.6

## 2016-03-18 MED ORDER — ACETAMINOPHEN 650 MG RE SUPP: 650.0000 mg | Freq: Four times a day (QID) | RECTAL | Status: DC | PRN

## 2016-03-18 MED ORDER — MORPHINE SULFATE (PF) 2 MG/ML IV SOLN
2.0000 mg | INTRAVENOUS | Status: DC | PRN
  Administered 2016-03-19: 2 mg via INTRAVENOUS
  Filled 2016-03-18: qty 1

## 2016-03-18 MED ORDER — SODIUM CHLORIDE 0.9% FLUSH
3.0000 mL | Freq: Two times a day (BID) | INTRAVENOUS | Status: DC
  Administered 2016-03-18 – 2016-03-19 (×2): 3 mL via INTRAVENOUS

## 2016-03-18 MED ORDER — PANTOPRAZOLE SODIUM 40 MG PO TBEC
40.0000 mg | DELAYED_RELEASE_TABLET | Freq: Every day | ORAL | Status: DC
  Administered 2016-03-18: 40 mg via ORAL
  Filled 2016-03-18: qty 1

## 2016-03-18 MED ORDER — SODIUM CHLORIDE 0.9 % IV BOLUS (SEPSIS)
250.0000 mL | Freq: Once | INTRAVENOUS | Status: AC
  Administered 2016-03-18: 250 mL via INTRAVENOUS

## 2016-03-18 MED ORDER — DEXTROSE 5 % IV SOLN
500.0000 mg | INTRAVENOUS | Status: DC
  Filled 2016-03-18: qty 500

## 2016-03-18 MED ORDER — TRAZODONE HCL 50 MG PO TABS
25.0000 mg | ORAL_TABLET | Freq: Every evening | ORAL | Status: DC | PRN
  Administered 2016-03-18: 25 mg via ORAL
  Filled 2016-03-18: qty 1

## 2016-03-18 MED ORDER — TRANDOLAPRIL 4 MG PO TABS
8.0000 mg | ORAL_TABLET | Freq: Every day | ORAL | Status: DC
  Administered 2016-03-18: 8 mg via ORAL
  Filled 2016-03-18 (×3): qty 2

## 2016-03-18 MED ORDER — DEXTROSE 5 % IV SOLN
500.0000 mg | Freq: Once | INTRAVENOUS | Status: AC
  Administered 2016-03-18: 500 mg via INTRAVENOUS
  Filled 2016-03-18: qty 500

## 2016-03-18 MED ORDER — AMLODIPINE BESYLATE 5 MG PO TABS
5.0000 mg | ORAL_TABLET | Freq: Every day | ORAL | Status: DC
  Administered 2016-03-18: 5 mg via ORAL
  Filled 2016-03-18: qty 1

## 2016-03-18 MED ORDER — DEXTROSE 5 % IV SOLN
1.0000 g | Freq: Once | INTRAVENOUS | Status: AC
  Administered 2016-03-18: 1 g via INTRAVENOUS
  Filled 2016-03-18: qty 10

## 2016-03-18 MED ORDER — ENOXAPARIN SODIUM 60 MG/0.6ML ~~LOC~~ SOLN
1.0000 mg/kg | Freq: Once | SUBCUTANEOUS | Status: AC
  Administered 2016-03-18: 60 mg via SUBCUTANEOUS
  Filled 2016-03-18 (×2): qty 0.6

## 2016-03-18 MED ORDER — ONDANSETRON HCL 4 MG/2ML IJ SOLN
4.0000 mg | Freq: Once | INTRAMUSCULAR | Status: AC
  Administered 2016-03-18: 4 mg via INTRAVENOUS
  Filled 2016-03-18: qty 2

## 2016-03-18 MED ORDER — POTASSIUM CHLORIDE 10 MEQ/100ML IV SOLN
10.0000 meq | INTRAVENOUS | Status: DC
  Administered 2016-03-18 (×4): 10 meq via INTRAVENOUS
  Filled 2016-03-18 (×4): qty 100

## 2016-03-18 MED ORDER — POTASSIUM CHLORIDE 10 MEQ/100ML IV SOLN
10.0000 meq | INTRAVENOUS | Status: DC
  Administered 2016-03-18: 10 meq via INTRAVENOUS
  Filled 2016-03-18: qty 100

## 2016-03-18 MED ORDER — AZITHROMYCIN 500 MG IV SOLR
500.0000 mg | INTRAVENOUS | Status: DC
  Administered 2016-03-19: 500 mg via INTRAVENOUS
  Filled 2016-03-18: qty 500

## 2016-03-18 MED ORDER — LEVOTHYROXINE SODIUM 112 MCG PO TABS: 112.0000 ug | ORAL_TABLET | Freq: Every day | ORAL | Status: DC

## 2016-03-18 MED ORDER — MAGNESIUM SULFATE 2 GM/50ML IV SOLN
2.0000 g | Freq: Once | INTRAVENOUS | Status: AC
  Administered 2016-03-18: 2 g via INTRAVENOUS
  Filled 2016-03-18: qty 50

## 2016-03-18 MED ORDER — POTASSIUM CHLORIDE 10 MEQ/100ML IV SOLN: 10.0000 meq | Freq: Once | INTRAVENOUS | Status: DC

## 2016-03-18 MED ORDER — PANTOPRAZOLE SODIUM 40 MG PO TBEC: 40.0000 mg | DELAYED_RELEASE_TABLET | Freq: Every day | ORAL | Status: DC

## 2016-03-18 NOTE — ED Notes (Signed)
Admitting at bedside 

## 2016-03-18 NOTE — ED Notes (Signed)
Attempted report 

## 2016-03-18 NOTE — ED Notes (Signed)
Pt presents via [facility] from home with family c/o generalized weakness x 2 weeks.  Pt also reports body aches, chills, SOB, cough, decreased appetite, Denies N/V/D.  EMS reports intial O2 sats 80s RA, no O2 at home,  NRB placed, O2 increased to 96%.  Pt normally independent per EMS.  EKG LBBB A fib-80s-150s, pt reports no hx of.  BP-150/86 P-88 R-24, O2-93% 4L.  Hx: PNA, DM, MI, HLD.  CBG 301, 18g LAC.Pt a x 4, NAD, states she is "sick".

## 2016-03-18 NOTE — Consult Note (Addendum)
CARDIOLOGY CONSULT NOTE   Patient ID: Lori Schwartz MRN: [id number] DOB/AGE: Jan 24, 1925 82 y.o.  Admit date: 02/23/2016  Primary Physician   Mathews Argyle, MD Primary Cardiologist   Was Dr Rex Kras Reason for Consultation   Atrial fib, anticoagulation  [id number] H Lori Schwartz is a 82 y.o. year old female with a history of HTN, DM, HLD, Takotsubo CM 2010, no sig CAD at cath. EF was 40% 2010, normal by echo 05/2015 when hospitalized for PNA and respiratory failure.   Ms. Lori Schwartz has memory problems and lives with family. She was brought to the hospital today for nausea and vomiting, weakness, general malaise and shortness of breath. She was in atrial fibrillation with a rapid ventricular rate, and cardiology was asked to evaluate her.  Mr. Lori Schwartz has no awareness of her rapid or irregular heart rate. She says that she has never passed out. She thinks that she has had some presyncope, but can't remember when the last episode was. She does not ever remember being told about atrial fibrillation in the past. There are no telemetry strips from her previous admissions and the only ECG in the system is from May 2016, sinus rhythm with a left bundle branch block.   She cannot say how long the shortness of breath has been coming on. When evaluated by primary care, she mentioned an episode of chest pain but could not provide details. At this time, she does not remember having any chest pain last night and the daughter is unable to provide any information.   According to ER notes, she has been getting gradually worse for 2 weeks. She has been weak and had a cough. There is also some nausea and dry heaves. Her by mouth intake has been poor.    Past Medical History  Diagnosis Date  . Diabetes mellitus without complication ([place])   . Hypertension   . Thyroid disease   . Cancer ([place])     breast  . Acute MI ([place]) 2010    2nd Takotsubo CM  . Hyperlipidemia   . Arthritis   . GERD  (gastroesophageal reflux disease)   . Takotsubo cardiomyopathy 2010    no sig CAD at cath, EF 40%     Past Surgical History  Procedure Laterality Date  . Breast surgery      L mastectomy   . Thyroid surgery    . Appendectomy    . Mastectomy      left  . Cardiac catheterization  2002    No sig CAD, CFX dilated, no RAS    Allergies  Allergen Reactions  . Codeine Nausea And Vomiting  . Statins     Muscle pain   . Latex Itching and Rash    I have reviewed the patient's current medications   . [START ON 03/19/2016] azithromycin    . [START ON 03/19/2016] cefTRIAXone (ROCEPHIN)  IV    . magnesium sulfate 1 - 4 g bolus IVPB    . potassium chloride    . sodium chloride     morphine injection  Medication Sig  acetaminophen (TYLENOL) 325 MG tablet Take 650 mg by mouth every 6 (six) hours as needed for moderate pain or headache.  amLODipine (NORVASC) 5 MG tablet Take 5 mg by mouth daily.  aspirin EC 81 MG tablet Take 81 mg by mouth daily.  calcium-vitamin D (OSCAL WITH D) 500-200 MG-UNIT per tablet Take 1 tablet by mouth daily.  gabapentin (NEURONTIN) 100 MG capsule Take  100 mg by mouth at bedtime.  glipiZIDE (GLUCOTROL XL) 10 MG 24 hr tablet Take 20 mg by mouth daily.  hydrochlorothiazide (MICROZIDE) 12.5 MG capsule Take 12.5 mg by mouth daily.  insulin detemir (LEVEMIR) 100 UNIT/ML injection Inject 4 Units into the skin at bedtime.   levothyroxine (SYNTHROID, LEVOTHROID) 112 MCG tablet Take 112 mcg by mouth daily.  linagliptin (TRADJENTA) 5 MG TABS tablet Take 5 mg by mouth daily.  metoprolol succinate (TOPROL-XL) 50 MG 24 hr tablet Take 50 mg by mouth 2 (two) times daily. Take with or immediately following a meal.  Multiple Vitamin (MULTIVITAMIN WITH MINERALS) TABS tablet Take 1 tablet by mouth daily.  omeprazole (PRILOSEC) 20 MG capsule Take 20 mg by mouth daily.  senna (SENOKOT) 8.6 MG tablet Take 1 tablet by mouth daily as needed for constipation.  sodium chloride (OCEAN)  0.65 % SOLN nasal spray Place 1 spray into both nostrils 3 (three) times daily.  traMADol (ULTRAM) 50 MG tablet Take 50 mg by mouth every 4 (four) hours as needed for moderate pain.   trandolapril (MAVIK) 4 MG tablet Take 8 mg by mouth daily.  amoxicillin-clavulanate (AUGMENTIN) 875-125 MG per tablet Take 1 tablet by mouth 2 (two) times daily.     Social History   Social History  . Marital Status: Widowed    Spouse Name: N/A  . Number of Children: N/A  . Years of Education: N/A   Occupational History  . Retired    Social History Main Topics  . Smoking status: Never Smoker   . Smokeless tobacco: Not on file  . Alcohol Use: No  . Drug Use: No  . Sexual Activity: No   Other Topics Concern  . Not on file   Social History Narrative   Lives with son and dtr-in-law. Also has a daughter in the area.    Family Status  Relation Status Death Age  . Maternal Grandmother Deceased   . Maternal Grandfather Deceased   . Paternal Grandmother Deceased   . Paternal Grandfather Deceased   . Father Deceased   . Mother Deceased       ROS:  Full 14 point review of systems complete and found to be negative unless listed above.  Physical Exam: Blood pressure 140/85, pulse 109, temperature 99.1 F (37.3 C), temperature source Rectal, resp. rate 20, height 5\' 4"  (1.626 m), weight 132 lb (59.875 kg), SpO2 95 %.  General: Well developed, well nourished, female in no acute distress Head: Eyes PERRLA, No xanthomas.   Normocephalic and atraumatic, oropharynx without edema or exudate. Dentition: Poor Lungs: Decreased breath sounds bilateral bases with some rales Heart: Heart irregular rate and rhythm with S1, S2  murmur. pulses are 2+ all 4 extrem.   Neck: No carotid bruits. No lymphadenopathy.  JVD not elevated. Abdomen: Bowel sounds present, abdomen soft and non-tender without masses or hernias noted. Msk:  No spine or cva tenderness. Generalized weakness, no joint deformities or  effusions. Extremities: No clubbing or cyanosis. Trace edema.  Neuro: Alert and oriented X 2. No focal deficits noted. Psych:  Good affect, responds appropriately Skin: No rashes or lesions noted.  Labs:   Lab Results  Component Value Date   WBC 15.7* 03/04/2016   HGB 15.7* 02/23/2016   HCT 43.1 02/29/2016   MCV 85.5 03/07/2016   PLT 186 03/08/2016     Recent Labs Lab 02/19/2016 1227  NA 128*  K 2.5*  CL 82*  CO2 34*  BUN 6  CREATININE 0.64  CALCIUM 8.8*  GLUCOSE 286*   MAGNESIUM  Date Value Ref Range Status  03/17/2016 2.1 1.7 - 2.4 mg/dL Final    Recent Labs  03/04/2016 1227  TROPONINI 0.04*    Recent Labs  02/27/2016 1148  TROPIPOC 0.01   B NATRIURETIC PEPTIDE  Date/Time Value Ref Range Status  03/19/2016 11:29 AM 103.0* 0.0 - 100.0 pg/mL Final  05/18/2015 03:00 PM 34.1 0.0 - 100.0 pg/mL Final    Echo:  - Left ventricle: The cavity size was mildly reduced. Wall  thickness was increased in a pattern of mild LVH. Systolic  function was vigorous. The estimated ejection fraction was in the  range of 65% to 70%. Doppler parameters are consistent with  abnormal left ventricular relaxation (grade 1 diastolic dysfunction). - Ventricular septum: Septal motion showed abnormal function and  dyssynergy. - Aortic valve: Trileaflet; mildly calcified leaflets. - Mitral valve: Calcified annulus. There was trivial regurgitation. - Left atrium: The atrium was at the upper limits of normal in size. - Right atrium: Central venous pressure (est): 3 mm Hg. - Atrial septum: The interatrial septum was hypermobile. No defect  or patent foramen ovale was identified. - Tricuspid valve: There was mild regurgitation. - Pulmonary arteries: PA peak pressure: 31 mm Hg (S). - Pericardium, extracardiac: A prominent pericardial fat pad was  present. Impressions: - Limited images. There is mild LVH with small chamber size and  LVEF 65-70%. Grade 1 diastolic dysfunction.  Septal dyssynergy  suggests IVCD. Upper normal left atrial size. Mild tricuspid  regurgitation with PASP 31 mmHg. Hypermobile interatrial septum  without obvious PFO or ASD.  ECG:  Atrial fib, heart rate 97, left bundle branch block is old  Radiology:  Dg Chest Port 1 View\ 03/14/2016  CLINICAL DATA:  Shortness of Breath EXAM: PORTABLE CHEST 1 VIEW COMPARISON:  05/31/2015 FINDINGS: Bibasilar airspace opacities are noted, concerning for pneumonia. Heart is normal size. Surgical clips in the left axilla. No effusions or acute bony abnormality. IMPRESSION: Bilateral lower lobe airspace opacities concerning for pneumonia. Electronically Signed   By: Rolm Baptise M.D.   On: 03/04/2016 12:04    ASSESSMENT AND PLAN:   The patient was seen today by Dr Tamala Julian, the patient evaluated and the data reviewed.   1. Atrial fibrillation, rapid ventricular response at times. - Feel heart rate is elevated secondary to acute illness. - She is on her home dose of Toprol-XL, 50 mg twice a day  - would continue home BB with no dose change as HR is frequently < 100 and BP is OK  2. Anticoagulation - CHADS2VASC=4, for female, HTN, age - discuss fall risk with family - family helps with meds, do not think compliance a problem - will start full dose anticoagulation with Lovenox - decide on oral anticoagulation once her functional status is more closely assessed.  Otherwise, per IM Active Problems:   CAP (community acquired pneumonia)   Acute respiratory failure with hypoxia ([place])   Diabetes mellitus without complication ([place])   Hypertension   Leukocytosis   Atrial fibrillation (H[facility])   Elevated troponin   Prolonged Q-T interval on ECG   Signed: Lenoard Aden 02/24/2016 4:28 PM Beeper [id number]  Co-Sign MD The patient has been seen in conjunction with Rosaria Ferries, ENT. All aspects of care have been considered and discussed. The patient has been personally interviewed, examined, and all  clinical data has been reviewed.   Admitted with progressive fatigue and feeling ill.  Rhonchi are heard in the bases  bilaterally.  EKG on admission consistent with atrial fibrillation. Monitor now raises the question of sinus rhythm with frequent PACs/wandering atrial pacemaker/MAT  She has risk factors for stroke that exceed a CHADS VASC greater than 3. Recommend therapeutic Lovenox for the time being. 2-D echocardiogram to assess LV size and function. Telemetry to monitor rhythm.  Intensify rate control measures if needed.  ECG in a.m.  Tran cardiac markers.

## 2016-03-18 NOTE — ED Provider Notes (Signed)
CSN: [id number]     Arrival date & time 02/28/2016  1103 History   First MD Initiated Contact with Patient 03/06/2016 1127     Chief Complaint  Patient presents with  . Weakness      HPI  Patient presents via EMS from her home accompanied by family with a description of a 2 week illness, and worsening over the last 2 days.  Patient has a history of hypertension, insulin-dependent diabetes, hypothyroidism, breast cancer, previous non-STEMI, GERD, and arthritis.  He has felt weak with a cough for at least 7 days time. Over last 24 hours she's become more weak and short of breath. Uncertain about fever. Worsening cough. States she feels worse laying supine. No leg swelling. No productive cough. No hemoptysis.    Past Medical History  Diagnosis Date  . Diabetes mellitus without complication ([place])   . Hypertension   . Thyroid disease   . Cancer [facility])     breast  . Acute MI [facility]) 2011?  [place] Hyperlipidemia   . Arthritis   . GERD (gastroesophageal reflux disease)    Past Surgical History  Procedure Laterality Date  . Breast surgery      L mastectomy   . Thyroid surgery    . Appendectomy    . Mastectomy      left   No family history on file. Social History  Substance Use Topics  . Smoking status: Never Smoker   . Smokeless tobacco: None  . Alcohol Use: No   OB History    No data available     Review of Systems  Constitutional: Positive for fatigue. Negative for fever, chills, diaphoresis, appetite change and unexpected weight change.  HENT: Negative for mouth sores, sore throat and trouble swallowing.   Eyes: Negative for visual disturbance.  Respiratory: Positive for cough and shortness of breath. Negative for chest tightness and wheezing.   Cardiovascular: Negative for chest pain.  Gastrointestinal: Negative for nausea, vomiting, abdominal pain, diarrhea and abdominal distention.  Endocrine: Negative for polydipsia, polyphagia and polyuria.  Genitourinary: Negative for  dysuria, frequency and hematuria.  Musculoskeletal: Negative for gait problem.  Skin: Negative for color change, pallor and rash.  Neurological: Positive for weakness. Negative for dizziness, syncope, light-headedness and headaches.  Hematological: Does not bruise/bleed easily.  Psychiatric/Behavioral: Negative for behavioral problems and confusion.      Allergies  Codeine; Statins; and Latex  Home Medications   Prior to Admission medications   Medication Sig Start Date End Date Taking? Authorizing Provider  acetaminophen (TYLENOL) 325 MG tablet Take 650 mg by mouth every 6 (six) hours as needed for moderate pain or headache.   Yes Historical Provider, MD  amLODipine (NORVASC) 5 MG tablet Take 5 mg by mouth daily.   Yes Historical Provider, MD  aspirin EC 81 MG tablet Take 81 mg by mouth daily.   Yes Historical Provider, MD  calcium-vitamin D (OSCAL WITH D) 500-200 MG-UNIT per tablet Take 1 tablet by mouth daily.   Yes Historical Provider, MD  gabapentin (NEURONTIN) 100 MG capsule Take 100 mg by mouth at bedtime.   Yes Historical Provider, MD  glipiZIDE (GLUCOTROL XL) 10 MG 24 hr tablet Take 20 mg by mouth daily.   Yes Historical Provider, MD  hydrochlorothiazide (MICROZIDE) 12.5 MG capsule Take 12.5 mg by mouth daily.   Yes Historical Provider, MD  insulin detemir (LEVEMIR) 100 UNIT/ML injection Inject 4 Units into the skin at bedtime.    Yes Historical Provider, MD  levothyroxine (  SYNTHROID, LEVOTHROID) 112 MCG tablet Take 112 mcg by mouth daily.   Yes Historical Provider, MD  linagliptin (TRADJENTA) 5 MG TABS tablet Take 5 mg by mouth daily.   Yes Historical Provider, MD  metoprolol succinate (TOPROL-XL) 50 MG 24 hr tablet Take 50 mg by mouth 2 (two) times daily. Take with or immediately following a meal.   Yes Historical Provider, MD  Multiple Vitamin (MULTIVITAMIN WITH MINERALS) TABS tablet Take 1 tablet by mouth daily.   Yes Historical Provider, MD  omeprazole (PRILOSEC) 20 MG  capsule Take 20 mg by mouth daily.   Yes Historical Provider, MD  senna (SENOKOT) 8.6 MG tablet Take 1 tablet by mouth daily as needed for constipation.   Yes Historical Provider, MD  sodium chloride (OCEAN) 0.65 % SOLN nasal spray Place 1 spray into both nostrils 3 (three) times daily.   Yes Historical Provider, MD  traMADol (ULTRAM) 50 MG tablet Take 50 mg by mouth every 4 (four) hours as needed for moderate pain.    Yes Historical Provider, MD  trandolapril (MAVIK) 4 MG tablet Take 8 mg by mouth daily.   Yes Historical Provider, MD  amoxicillin-clavulanate (AUGMENTIN) 875-125 MG per tablet Take 1 tablet by mouth 2 (two) times daily. 05/24/15   Janece Canterbury, MD   BP 129/88 mmHg  Pulse 85  Temp(Src) 99.1 F (37.3 C) (Rectal)  Resp 25  Ht 5\' 4"  (1.626 m)  Wt 132 lb (59.875 kg)  BMI 22.65 kg/m2  SpO2 94% Physical Exam  Constitutional: She is oriented to person, place, and time. She appears well-developed and well-nourished. No distress.  HENT:  Head: Normocephalic.  Eyes: Conjunctivae are normal. Pupils are equal, round, and reactive to light. No scleral icterus.  Neck: Normal range of motion. Neck supple. No thyromegaly present.  No JVD.  Cardiovascular: Normal rate.  An irregularly irregular rhythm present. Exam reveals no gallop and no friction rub.   No murmur heard. Atrial fibrillation with controlled response on the monitor, rate 97.  No S3 or 4 gallop.  No dependent edema in the legs.  Pulmonary/Chest: Effort normal and breath sounds normal. No respiratory distress. She has no wheezes. She has no rales.  Bibasilar crackles right greater than left.  Abdominal: Soft. Bowel sounds are normal. She exhibits no distension. There is no tenderness. There is no rebound.  Musculoskeletal: Normal range of motion.  Neurological: She is alert and oriented to person, place, and time.  Skin: Skin is warm and dry. No rash noted.  Psychiatric: She has a normal mood and affect. Her behavior  is normal.    ED Course  Procedures (including critical care time) Labs Review Labs Reviewed  CBC - Abnormal; Notable for the following:    WBC 15.7 (*)    Hemoglobin 15.7 (*)    MCHC 36.4 (*)    All other components within normal limits  BRAIN NATRIURETIC PEPTIDE - Abnormal; Notable for the following:    B Natriuretic Peptide 103.0 (*)    All other components within normal limits  BASIC METABOLIC PANEL - Abnormal; Notable for the following:    Sodium 128 (*)    Potassium 2.5 (*)    Chloride 82 (*)    CO2 34 (*)    Glucose, Bld 286 (*)    Calcium 8.8 (*)    All other components within normal limits  TROPONIN I - Abnormal; Notable for the following:    Troponin I 0.04 (*)    All other components within  normal limits  CBG MONITORING, ED - Abnormal; Notable for the following:    Glucose-Capillary 281 (*)    All other components within normal limits  CULTURE, BLOOD (ROUTINE X 2)  CULTURE, BLOOD (ROUTINE X 2)  URINALYSIS, ROUTINE W REFLEX MICROSCOPIC (NOT AT [facility])  Randolm Idol, ED  I-STAT CG4 LACTIC ACID, ED    Imaging Review Dg Chest Port 1 View  03/02/2016  CLINICAL DATA:  Shortness of Breath EXAM: PORTABLE CHEST 1 VIEW COMPARISON:  05/31/2015 FINDINGS: Bibasilar airspace opacities are noted, concerning for pneumonia. Heart is normal size. Surgical clips in the left axilla. No effusions or acute bony abnormality. IMPRESSION: Bilateral lower lobe airspace opacities concerning for pneumonia. Electronically Signed   By: Rolm Baptise M.D.   On: 03/13/2016 12:04   I have personally reviewed and evaluated these images and lab results as part of my medical decision-making.   EKG Interpretation   Date/Time:  Wednesday March 18 2016 11:20:09 EDT Ventricular Rate:  97 PR Interval:    QRS Duration: 155 QT Interval:  409 QTC Calculation: 520 R Axis:   -63 Text Interpretation:  AF  Left bundle branch block Confirmed by Jeneen Rinks  MD,  Pinch ([id number]) on 02/19/2016 11:29:55 AM       MDM   Final diagnoses:  CAP (community acquired pneumonia)  Atrial fibrillation, unspecified type ([place])  Hyponatremia  Hypokalemia    Atrial fibrillation is new to the patient, and per the patient's daughter, and per the chart.  Clinically, radiographically not in congestive heart failure. BNP only at 100. A. fib with controlled response. Chads of asked to score of 6. Chest x-ray shows pneumonia, right greater than left, lower lobe. No fever on rectal temp, but leukocytosis. Cultures obtained. I-STAT troponin 0.01. Troponin I 0.04. No significant elevation. Hyponatremic at 128. K want 2.5. Lactate 1.7. This is a community acquired pneumonia. Patient given Rocephin and Zithromax. She does complain of burning in her esophagus and occasionally that dry foods such as crackers will not go down. Describes difficulty swallowing but denies regurgitation. Had previous lower lobe pneumonia last year,? Aspiration episodes.  Will discuss with hospitalist regarding disposition.    Tanna Furry, MD 03/19/2016 [phone]

## 2016-03-18 NOTE — ED Notes (Signed)
MD Jeneen Rinks aware of critical K-2.5

## 2016-03-18 NOTE — ED Notes (Signed)
MD at bedside. 

## 2016-03-18 NOTE — Progress Notes (Signed)
ANTICOAGULATION CONSULT NOTE - Initial Consult  Pharmacy Consult for Lovenox Indication: atrial fibrillation  Allergies  Allergen Reactions  . Codeine Nausea And Vomiting  . Statins     Muscle pain   . Latex Itching and Rash    Patient Measurements: Height: 5\' 4"  (162.6 cm) Weight: 132 lb (59.875 kg) IBW/kg (Calculated) : 54.7 Heparin Dosing Weight:   Vital Signs: Temp: 99.1 F (37.3 C) (03/29 1144) Temp Source: Rectal (03/29 1144) BP: 140/85 mmHg (03/29 1600) Pulse Rate: 84 (03/29 1630)  Labs:  Recent Labs  02/28/2016 1129 02/21/2016 1227  HGB 15.7*  --   HCT 43.1  --   PLT 186  --   CREATININE  --  0.64  TROPONINI  --  0.04*    Estimated Creatinine Clearance: 42 mL/min (by C-G formula based on Cr of 0.64).   Medical History: Past Medical History  Diagnosis Date  . Diabetes mellitus without complication ([place])   . Hypertension   . Thyroid disease   . Cancer ([place])     breast  . Acute MI ([place]) 2010    2nd Takotsubo CM  . Hyperlipidemia   . Arthritis   . GERD (gastroesophageal reflux disease)   . Takotsubo cardiomyopathy 2010    no sig CAD at cath, EF 40%    Medications:   (Not in a hospital admission) Scheduled:  . metoprolol succinate  50 mg Oral BID   Infusions:  . [START ON 03/19/2016] azithromycin    . [START ON 03/19/2016] cefTRIAXone (ROCEPHIN)  IV    . magnesium sulfate 1 - 4 g bolus IVPB 2 g (03/11/2016 1635)  . potassium chloride 10 mEq (03/17/2016 1635)    Assessment: 82yo female presents with N/V, weakness, general malaise and SOB. Pharmacy is consulted to dose lovenox for atrial fibrillation. Hgb 15.7, Plt 186, BNP 103, Trop 0.04, sCr 0.64.  Goal of Therapy:  Monitor platelets by anticoagulation protocol: Yes   Plan:  Lovenox 1mg /kg subcutaneously q12h Monitor s/sx of bleeding F/u long term anticoag plan   Andrey Cota. Diona Foley, PharmD, [place] Clinical Pharmacist Pager [phone] 03/06/2016,5:19 PM

## 2016-03-18 NOTE — H&P (Signed)
[facility] History and Physical  Lori Schwartz [id number] DOB: [birth year] DOA: 03/17/2016  Referring physician: Emergency Department PCP: Mathews Argyle, MD   CHIEF COMPLAINT:                   HPI: Lori Schwartz is a 82 y.o. female presenting with a two week hstory of body aches, fatigue and progressive shortness of breath. No significant cough. She endorses an episode of substernal chest pain last night but is very vague about the details. No chest pain today. Patient keeps saying she just feels terrible, she endorses nausea. This morning patient was hypoxic upon EMS arrival with O2 sats in the eighties. She was placed on a nonrebreather. Currently on 5 L of oxygen per nasal cannula with sats in the upper nineties.    EKG:    AF Left bundle branch block Confirmed by Jeneen Rinks MD, [place] ([id number]) on 03/05/2016 11:29:55 AM                   Medications  azithromycin (ZITHROMAX) 500 mg in dextrose 5 % 250 mL IVPB (500 mg Intravenous New Bag/Given 03/02/2016 1412)  cefTRIAXone (ROCEPHIN) 1 g in dextrose 5 % 50 mL IVPB (0 g Intravenous Stopped 03/03/2016 1332)    Review of Systems  Constitutional: Positive for chills.  HENT: Negative.   Eyes: Negative.   Respiratory: Negative.   Cardiovascular: Positive for chest pain.  Gastrointestinal: Positive for nausea.  Genitourinary: Negative.   Skin: Negative.   Neurological: Negative.   Endo/Heme/Allergies: Negative.   Psychiatric/Behavioral: Negative.     Past Medical History  Diagnosis Date  . Diabetes mellitus without complication ([place])   . Hypertension   . Thyroid disease   . Cancer [facility])     breast  . Acute MI [facility]) 2011?  [place] Hyperlipidemia   . Arthritis   . GERD (gastroesophageal reflux disease)    Past Surgical History  Procedure Laterality Date  . Breast surgery      L mastectomy   . Thyroid surgery    . Appendectomy    . Mastectomy      left    SOCIAL HISTORY:  reports that she has never smoked. She does not  have any smokeless tobacco history on file. She reports that she does not drink alcohol or use illicit drugs. Lives:  At home with son     Assistive devices:   walker needed for ambulation.   Allergies  Allergen Reactions  . Codeine Nausea And Vomiting  . Statins     Muscle pain   . Latex Itching and Rash     FMH: Heart failure in several family members  Prior to Admission medications   Medication Sig Start Date End Date Taking? Authorizing Provider  acetaminophen (TYLENOL) 325 MG tablet Take 650 mg by mouth every 6 (six) hours as needed for moderate pain or headache.   Yes Historical Provider, MD  amLODipine (NORVASC) 5 MG tablet Take 5 mg by mouth daily.   Yes Historical Provider, MD  aspirin EC 81 MG tablet Take 81 mg by mouth daily.   Yes Historical Provider, MD  calcium-vitamin D (OSCAL WITH D) 500-200 MG-UNIT per tablet Take 1 tablet by mouth daily.   Yes Historical Provider, MD  gabapentin (NEURONTIN) 100 MG capsule Take 100 mg by mouth at bedtime.   Yes Historical Provider, MD  glipiZIDE (GLUCOTROL XL) 10 MG 24 hr tablet Take 20 mg by mouth daily.   Yes Historical Provider,  MD  hydrochlorothiazide (MICROZIDE) 12.5 MG capsule Take 12.5 mg by mouth daily.   Yes Historical Provider, MD  insulin detemir (LEVEMIR) 100 UNIT/ML injection Inject 4 Units into the skin at bedtime.    Yes Historical Provider, MD  levothyroxine (SYNTHROID, LEVOTHROID) 112 MCG tablet Take 112 mcg by mouth daily.   Yes Historical Provider, MD  linagliptin (TRADJENTA) 5 MG TABS tablet Take 5 mg by mouth daily.   Yes Historical Provider, MD  metoprolol succinate (TOPROL-XL) 50 MG 24 hr tablet Take 50 mg by mouth 2 (two) times daily. Take with or immediately following a meal.   Yes Historical Provider, MD  Multiple Vitamin (MULTIVITAMIN WITH MINERALS) TABS tablet Take 1 tablet by mouth daily.   Yes Historical Provider, MD  omeprazole (PRILOSEC) 20 MG capsule Take 20 mg by mouth daily.   Yes Historical Provider,  MD  senna (SENOKOT) 8.6 MG tablet Take 1 tablet by mouth daily as needed for constipation.   Yes Historical Provider, MD  sodium chloride (OCEAN) 0.65 % SOLN nasal spray Place 1 spray into both nostrils 3 (three) times daily.   Yes Historical Provider, MD  trandolapril (MAVIK) 4 MG tablet Take 8 mg by mouth daily.   Yes Historical Provider, MD   PHYSICAL EXAM: Filed Vitals:   02/25/2016 1245 02/25/2016 1300 02/25/2016 1330 02/29/2016 1400  BP:  129/88 132/78 121/73  Pulse:  85 91 89  Temp:      TempSrc:      Resp: 25   22  Height:      Weight:      SpO2:  94% 96% 96%    Wt Readings from Last 3 Encounters:  03/06/2016 59.875 kg (132 lb)  05/18/15 63.64 kg (140 lb 4.8 oz)  11/20/14 65.091 kg (143 lb 8 oz)    General:  Pleasant white female. Appears calm and comfortable Eyes: PER, normal lids, irises & conjunctiva ENT: grossly normal hearing, dry tongue Neck: no LAD, no masses Cardiovascular: RRR, no murmurs. No LE edema.  Respiratory: Respirations Slightly labored on 5 L per nasal cannula . Decreased breath sounds in the bilateral upper and lower lobes,  no wheezes / rales .   Abdomen: soft, non-distended, non-tender, active bowel sounds. No obvious masses.  Skin: no rash seen on limited exam Musculoskeletal: grossly normal tone BUE/BLE Psychiatric: grossly normal mood and affect, speech fluent and appropriate Neurologic: grossly non-focal.         LABS ON ADMISSION:    Basic Metabolic Panel:  Recent Labs Lab 03/01/2016 1227  NA 128*  K 2.5*  CL 82*  CO2 34*  GLUCOSE 286*  BUN 6  CREATININE 0.64  CALCIUM 8.8*    CBC:  Recent Labs Lab 03/05/2016 1129  WBC 15.7*  HGB 15.7*  HCT 43.1  MCV 85.5  PLT 186    CREATININE: 0.64 (03/08/2016 1227) Estimated creatinine clearance - 42 mL/min  Radiological Exams on Admission: Dg Chest Port 1 View  03/16/2016  CLINICAL DATA:  Shortness of Breath EXAM: PORTABLE CHEST 1 VIEW COMPARISON:  05/31/2015 FINDINGS: Bibasilar airspace  opacities are noted, concerning for pneumonia. Heart is normal size. Surgical clips in the left axilla. No effusions or acute bony abnormality. IMPRESSION: Bilateral lower lobe airspace opacities concerning for pneumonia. Electronically Signed   By: Rolm Baptise M.D.   On: 03/10/2016 12:04    ASSESSMENT / PLAN    Acute respiratory failure with hypoxia, + body aches Currently requiring 5 Liters 02 / [place]. CXR suggests bilateral  PNA. -admit to Telemetry (new afib) for treatment of CAP, order set utilized -Azithromycin + Rocephin - started in ED -Wean 02 at tolerated -influenza panel -PT consult   Dehydration, dry mucous membranes,decreased UOP. -gentle IV hydration given hx of heart failure. Give 245ml bolus then 1ml/hr  Atrial fibrillation, rate 80s-107. Chadsvasc 4-5. Had episode of atrial fibrillation once before per daughter.Patient's PCP retired and patient didn't want to return to discuss treatment.  -monitor on telemetry -give home dose of beta blocker now.  -Consulted Cardiology, not sure she is a good candidate for anticoagulants but will defer to   Cardiology  Leukocytosis. Likely secondary to CAP but u/a ordered.  -am CBC  Hypokalemia.  -Mg+ level pending. Probably will be low so will proceed with dose of Mg+ now.  -getting 36mEq IV in ED now. Will need additional doses so 5 additional runs of 43mEq   ordered.  -am bmet  Hx of heart failure. Last Echo May 2016 revealed grade I diastolic dysfunction , vigorous EF 65-70%. No evidence for decompensated heart failure at present.  -daily weights -strict I&0  Chest discomfort last night. Vague description, substernal in nature. No chest pain today. -monitor on telemetry -cycle troponin (initial one elevated) -continue home PPI  Prolonged QT interval.  -Low-dose Compazine for nausea. Phenergan in short supply here at the hospital, avoid  Zofran -Avoid QT prolonging medications as much as possible  Diabetes mellitus type 2    -  A1c -  Hold oral medications -  SSI  CAD / MI [id number]  Hx of systolic congestive heart failure, EF 40%. -daily weights , I+O   Hypertension. Controlled.  -continue home antihypertensives -Holding diuretics for now  CONSULTANTS:    Cardiology   Code Status: Do not intubate DVT Prophylaxis: Lovenox  Family Communication:  Patient alert, oriented and understands plan of care.  Disposition Plan: Discharge to home in 2-3 days   Time spent: 60 minutes Tye Savoy  NP [facility] Pager [phone]

## 2016-03-19 ENCOUNTER — Inpatient Hospital Stay (HOSPITAL_COMMUNITY): Payer: Medicare Other

## 2016-03-19 DIAGNOSIS — J189 Pneumonia, unspecified organism: Secondary | ICD-10-CM

## 2016-03-19 DIAGNOSIS — J9601 Acute respiratory failure with hypoxia: Secondary | ICD-10-CM

## 2016-03-19 LAB — BASIC METABOLIC PANEL
Anion gap: 11 (ref 5–15)
BUN: 10 mg/dL (ref 6–20)
CALCIUM: 8.6 mg/dL — AB (ref 8.9–10.3)
CHLORIDE: 82 mmol/L — AB (ref 101–111)
CO2: 30 mmol/L (ref 22–32)
CREATININE: 0.64 mg/dL (ref 0.44–1.00)
GFR calc Af Amer: >60 mL/min (ref >60)
GFR calc non Af Amer: >60 mL/min (ref >60)
Glucose, Bld: 353 mg/dL — ABNORMAL HIGH (ref 65–99)
Potassium: 3.7 mmol/L (ref 3.5–5.1)
Sodium: 123 mmol/L — ABNORMAL LOW (ref 135–145)

## 2016-03-19 LAB — CBC
HCT: 42.5 % (ref 36.0–46.0)
Hemoglobin: 14.5 g/dL (ref 12.0–15.0)
MCH: 30.1 pg (ref 26.0–34.0)
MCHC: 34.1 g/dL (ref 30.0–36.0)
MCV: 88.2 fL (ref 78.0–100.0)
PLATELETS: 165 10*3/uL (ref 150–400)
RBC: 4.82 MIL/uL (ref 3.87–5.11)
RDW: 13.3 % (ref 11.5–15.5)
WBC: 23.4 10*3/uL — ABNORMAL HIGH (ref 4.0–10.5)

## 2016-03-19 LAB — BLOOD GAS, ARTERIAL
Acid-Base Excess: 4.9 mmol/L — ABNORMAL HIGH (ref 0.0–2.0)
Bicarbonate: 34.3 meq/L — ABNORMAL HIGH (ref 20.0–24.0)
Drawn by: 280981
FIO2: 1
O2 Saturation: 95.7 %
Patient temperature: 98.6
TCO2: 37.9 mmol/L (ref 0–100)
pH, Arterial: 7.095 — CL (ref 7.350–7.450)
pO2, Arterial: 104 mmHg — ABNORMAL HIGH (ref 80.0–100.0)

## 2016-03-19 LAB — HIV ANTIBODY (ROUTINE TESTING W REFLEX): HIV SCREEN 4TH GENERATION: NONREACTIVE

## 2016-03-19 LAB — GLUCOSE, CAPILLARY
Glucose-Capillary: 336 mg/dL — ABNORMAL HIGH (ref 65–99)
Glucose-Capillary: 282 mg/dL — ABNORMAL HIGH (ref 65–99)
Glucose-Capillary: 71 mg/dL (ref 65–99)

## 2016-03-19 LAB — TROPONIN I: Troponin I: 0.04 ng/mL — ABNORMAL HIGH (ref <0.031)

## 2016-03-19 LAB — LACTIC ACID, PLASMA: Lactic Acid, Venous: 2.2 mmol/L (ref 0.5–2.0)

## 2016-03-19 MED ORDER — SENNA 8.6 MG PO TABS: 1.0000 | ORAL_TABLET | Freq: Every evening | ORAL | Status: DC | PRN

## 2016-03-19 MED ORDER — OSELTAMIVIR PHOSPHATE 75 MG PO CAPS: 75.0000 mg | ORAL_CAPSULE | Freq: Every day | ORAL | Status: DC

## 2016-03-19 MED ORDER — LORAZEPAM 1 MG PO TABS: 1.0000 mg | ORAL_TABLET | ORAL | Status: DC | PRN

## 2016-03-19 MED ORDER — NALOXONE HCL 0.4 MG/ML IJ SOLN
0.4000 mg | Freq: Once | INTRAMUSCULAR | Status: AC
  Administered 2016-03-19: 0.4 mg via INTRAVENOUS

## 2016-03-19 MED ORDER — INSULIN GLARGINE 100 UNIT/ML ~~LOC~~ SOLN
5.0000 [IU] | Freq: Every day | SUBCUTANEOUS | Status: DC
  Administered 2016-03-19: 5 [IU] via SUBCUTANEOUS
  Filled 2016-03-19: qty 0.05

## 2016-03-19 MED ORDER — MORPHINE SULFATE (PF) 2 MG/ML IV SOLN: 1.0000 mg | INTRAVENOUS | Status: DC

## 2016-03-19 MED ORDER — METOPROLOL SUCCINATE ER 25 MG PO TB24
25.0000 mg | ORAL_TABLET | Freq: Two times a day (BID) | ORAL | Status: DC
Start: 2016-03-19 — End: 2016-03-19

## 2016-03-19 MED ORDER — HALOPERIDOL LACTATE 2 MG/ML PO CONC
1.0000 mg | ORAL | Status: DC | PRN
  Administered 2016-03-19: 2 mg via ORAL
  Administered 2016-03-20: 1 mg via ORAL
  Filled 2016-03-19 (×3): qty 1

## 2016-03-19 MED ORDER — HALOPERIDOL 0.5 MG PO TABS
0.5000 mg | ORAL_TABLET | ORAL | Status: DC | PRN
  Filled 2016-03-19: qty 1

## 2016-03-19 MED ORDER — BIOTENE DRY MOUTH MT LIQD: 15.0000 mL | OROMUCOSAL | Status: DC | PRN

## 2016-03-19 MED ORDER — HYDROCOD POLST-CPM POLST ER 10-8 MG/5ML PO SUER
5.0000 mL | Freq: Once | ORAL | Status: AC
  Administered 2016-03-19: 5 mL via ORAL
  Filled 2016-03-19: qty 5

## 2016-03-19 MED ORDER — HALOPERIDOL LACTATE 2 MG/ML PO CONC: 2.0000 mg | ORAL | Status: DC | PRN

## 2016-03-19 MED ORDER — MORPHINE SULFATE (CONCENTRATE) 10 MG/0.5ML PO SOLN
10.0000 mg | ORAL | Status: DC | PRN
  Administered 2016-03-19: 10 mg via ORAL
  Filled 2016-03-19 (×2): qty 0.5

## 2016-03-19 MED ORDER — OSELTAMIVIR PHOSPHATE 30 MG PO CAPS
30.0000 mg | ORAL_CAPSULE | Freq: Two times a day (BID) | ORAL | Status: DC
  Filled 2016-03-19: qty 1

## 2016-03-19 MED ORDER — HALOPERIDOL LACTATE 5 MG/ML IJ SOLN: 0.5000 mg | INTRAMUSCULAR | Status: DC | PRN

## 2016-03-19 MED ORDER — NALOXONE HCL 0.4 MG/ML IJ SOLN
INTRAMUSCULAR | Status: AC
  Administered 2016-03-19: 0.4 mg
  Filled 2016-03-19: qty 1

## 2016-03-19 MED ORDER — NALOXONE HCL 0.4 MG/ML IJ SOLN
INTRAMUSCULAR | Status: AC
  Filled 2016-03-19: qty 1

## 2016-03-19 MED ORDER — OLANZAPINE 5 MG PO TBDP
5.0000 mg | ORAL_TABLET | Freq: Every day | ORAL | Status: DC
  Filled 2016-03-19 (×3): qty 1

## 2016-03-19 MED ORDER — PIPERACILLIN-TAZOBACTAM 3.375 G IVPB
3.3750 g | Freq: Three times a day (TID) | INTRAVENOUS | Status: DC
  Administered 2016-03-19: 3.375 g via INTRAVENOUS
  Filled 2016-03-19 (×3): qty 50

## 2016-03-19 MED ORDER — LORAZEPAM 2 MG/ML IJ SOLN: 1.0000 mg | INTRAMUSCULAR | Status: DC | PRN

## 2016-03-19 MED ORDER — ATROPINE SULFATE 1 % OP SOLN
4.0000 [drp] | OPHTHALMIC | Status: DC | PRN
  Administered 2016-03-19 – 2016-03-20 (×2): 4 [drp] via SUBLINGUAL
  Filled 2016-03-19: qty 2

## 2016-03-19 MED ORDER — GLYCOPYRROLATE 1 MG PO TABS
1.0000 mg | ORAL_TABLET | ORAL | Status: DC | PRN
  Filled 2016-03-19: qty 1

## 2016-03-19 MED ORDER — GLYCOPYRROLATE 0.2 MG/ML IJ SOLN
0.2000 mg | INTRAMUSCULAR | Status: DC | PRN
  Filled 2016-03-19: qty 1

## 2016-03-19 MED ORDER — LEVOTHYROXINE SODIUM 112 MCG PO TABS: 112.0000 ug | ORAL_TABLET | Freq: Every day | ORAL | Status: DC

## 2016-03-19 MED ORDER — VANCOMYCIN HCL IN DEXTROSE 750-5 MG/150ML-% IV SOLN
750.0000 mg | INTRAVENOUS | Status: DC
  Administered 2016-03-19: 750 mg via INTRAVENOUS
  Filled 2016-03-19: qty 150

## 2016-03-19 MED ORDER — LORAZEPAM 2 MG/ML PO CONC
1.0000 mg | ORAL | Status: DC | PRN
  Filled 2016-03-19: qty 0.5

## 2016-03-19 MED ORDER — HALOPERIDOL 0.5 MG PO TABS: 0.5000 mg | ORAL_TABLET | ORAL | Status: DC | PRN

## 2016-03-19 NOTE — Progress Notes (Addendum)
Patient Name: Lori Schwartz Date of Encounter: 03/19/2016  Active Problems:   CAP (community acquired pneumonia)   Acute respiratory failure with hypoxia ([place])   Diabetes mellitus without complication (HCC)   Hypertension   Leukocytosis   Atrial fibrillation (HCC)   Elevated troponin   Prolonged Q-T interval on ECG   Primary Cardiologist: New, Dr Tamala Julian  Patient Profile: 82 y.o. year old female with a history of HTN, DM, HLD, Takotsubo CM 2010, no sig CAD at cath. EF was 40% 2010, normal by echo 05/2015 when hospitalized for PNA and respiratory failure. Admitted with SOB and was in afib.   SUBJECTIVE: Pt not oriented. Had problems with agitation overnight, got medications and then got over-sedated with very abnl blood gas, placed on BiPAP and given Narcan. She was bradycardic and hypotensive so meds d/c'd.   Currently does not seem to recognize daughter. Does not cooperate with exam  OBJECTIVE Filed Vitals:   03/19/16 1008 03/19/16 1017 03/19/16 1018 03/19/16 1107  BP: 89/46   92/35  Pulse:   69   Temp:      TempSrc:      Resp:   21   Height:      Weight:      SpO2:  97% 97%     Intake/Output Summary (Last 24 hours) at 03/19/16 1113 Last data filed at 03/19/16 0300  Gross per 24 hour  Intake     50 ml  Output    450 ml  Net   -400 ml   Filed Weights   03/04/2016 1115 03/02/2016 1900 03/19/16 0526  Weight: 132 lb (59.875 kg) 132 lb 6.4 oz (60.056 kg) 134 lb 7.7 oz (61 kg)    PHYSICAL EXAM General: Well developed, well nourished, female in no acute distress. Head: Normocephalic, atraumatic.  Neck: Supple without bruits, JVD not elevated. Lungs:  Resp regular and unlabored, few rales. Heart: RRR, S1, S2, no S3, S4, or murmur; no rub. Abdomen: Soft, non-tender, non-distended, BS + x 4.  Extremities: No clubbing, cyanosis, edema.  Neuro: Alert and oriented X 3. Moves all extremities spontaneously. Psych: Normal affect.  LABS: ABG    Component Value Date/Time    PHART 7.095* 03/19/2016 0938   PCO2ART ABOVE REPORTABLE RANGE 03/19/2016 0938   PO2ART 104* 03/19/2016 0938   HCO3 34.3* 03/19/2016 0938   TCO2 37.9 03/19/2016 0938   O2SAT 95.7 03/19/2016 0938   CBC: Recent Labs  03/02/2016 1129 03/19/16 0320  WBC 15.7* 23.4*  HGB 15.7* 14.5  HCT 43.1 42.5  MCV 85.5 88.2  PLT 186 165   INR:No results for input(s): INR in the last 72 hours. Basic Metabolic Panel: Recent Labs  03/17/2016 1129 02/24/2016 1227 03/19/16 0320  NA  --  128* 123*  K  --  2.5* 3.7  CL  --  82* 82*  CO2  --  34* 30  GLUCOSE  --  286* 353*  BUN  --  6 10  CREATININE  --  0.64 0.64  CALCIUM  --  8.8* 8.6*  MG 2.1  --   --    Cardiac Enzymes: Recent Labs  03/11/2016 1558 03/08/2016 2140 03/19/16 0320  TROPONINI 0.06* 0.05* 0.04*    Recent Labs  03/15/2016 1148  TROPIPOC 0.01   BNP:  B NATRIURETIC PEPTIDE  Date/Time Value Ref Range Status  02/21/2016 11:29 AM 103.0* 0.0 - 100.0 pg/mL Final  05/18/2015 03:00 PM 34.1 0.0 - 100.0 pg/mL Final   TELE:  Sinus brady, SR, afib      Radiology/Studies: Dg Chest Port 1 View  03/07/2016  CLINICAL DATA:  Shortness of Breath EXAM: PORTABLE CHEST 1 VIEW COMPARISON:  05/31/2015 FINDINGS: Bibasilar airspace opacities are noted, concerning for pneumonia. Heart is normal size. Surgical clips in the left axilla. No effusions or acute bony abnormality. IMPRESSION: Bilateral lower lobe airspace opacities concerning for pneumonia. Electronically Signed   By: Rolm Baptise M.D.   On: 03/11/2016 12:04     Current Medications:  . aspirin EC  81 mg Oral Daily  . enoxaparin (LOVENOX) injection  1 mg/kg Subcutaneous Q12H  . insulin aspart  0-9 Units Subcutaneous TID WC  . insulin glargine  5 Units Subcutaneous Daily  . [START ON 04-07-2016] levothyroxine  112 mcg Oral QAC breakfast  . pantoprazole  40 mg Oral Daily  . piperacillin-tazobactam (ZOSYN)  IV  3.375 g Intravenous Q8H  . sodium chloride  1 spray Each Nare TID  . sodium  chloride flush  3 mL Intravenous Q12H  . vancomycin  750 mg Intravenous Q24H   . sodium chloride 150 mL/hr at 03/19/16 1004    ASSESSMENT AND PLAN: 1. Atrial fibrillation, rapid ventricular response at times. - Feel heart rate is elevated secondary to acute illness. - Home dose of Toprol-XL, 50 mg twice a day was d/c'd for low BP/HR - HR is frequently < 100  - Monitor now shows sinus rhythm with frequent afib vs PACs/wandering atrial pacemaker/MAT  2. Anticoagulation - CHADS2VASC=4, for female, HTN, age - discuss fall risk with family - family helps with meds, do not think compliance a problem - will start full dose anticoagulation with Lovenox - decide on oral anticoagulation once her functional status is more closely assessed. - right now, risk > benefit  Otherwise, per IM Active Problems:   CAP (community acquired pneumonia)   Acute respiratory failure with hypoxia ([place])   Diabetes mellitus without complication ([place])   Hypertension   Leukocytosis   Atrial fibrillation (HCC)   Elevated troponin   Prolonged Q-T interval on ECG   Signed, Barrett, Rhonda , PA-C 11:13 AM 03/19/2016 The patient has been seen in conjunction with Rosaria Ferries, PA-C. All aspects of care have been considered and discussed. The patient has been personally interviewed, examined, and all clinical data has been reviewed.   It does not appear that we have much to offer here. Medications have been discontinued because of hypotension. BiPAP was discontinued at the daughter's request. The family seems to support the idea of comfort care.  Goals of care need to be aligned by primary team. HCPOW wants DNR and Palliative care.  Please call if we may be of further assistance.

## 2016-03-19 NOTE — Significant Event (Addendum)
Rapid Response Event Note  Overview: Time Called: 0852 Arrival Time: 0855 Event Type: Neurologic, Respiratory  Initial Focused Assessment: Patient is difficult to arouse, pt pink.  Lung sounds coarse, decreased bases.  Regular heart tones BP113/55  SR 60s  RR 18  O2 sat 98% on NRB Dr Verlon Au at bedside Patient with partial code status:  meds only MD spoke with family they are OK if we need to place her on bipap  Interventions: Narcan given x 2, no change in mental status.  She is moving her extremities more but is still unresponsive. ABG done  Ph 7.04/ Pco2 unreadable/ Po2 104 Placed on Bipap,  RT adjusting settings.  1200: Per RN daughter at bedside requested that Bipap be d/c'd and she be a complete DNR with goal to keep her comfortable.  RN to call if assistance needed.     Event Summary: Name of Physician Notified: Dr Verlon Au at bedside at      at    Outcome: Transferred (Comment)     Raliegh Ip

## 2016-03-19 NOTE — Progress Notes (Signed)
PT Cancellation Note  Patient Details Name: Lori Schwartz MRN: [id number] DOB: 02/11/1925   Cancelled Treatment:    Reason Eval/Treat Not Completed: Medical issues which prohibited therapy. Pt received narcan this AM and possible transfer to step down.  Will hold off on PT today and will check on pt tomorrow.   Samil Mecham LUBECK 03/19/2016, 9:53 AM

## 2016-03-19 NOTE — Progress Notes (Signed)
Patients daughter at bedside and asked for bipap to be discontinued. Patient placed on 5L O2. MD made aware. Patient resting comfortably.

## 2016-03-19 NOTE — Progress Notes (Signed)
Patient unresponsive, breathing, pulses present. Called in second RN for help arousing pt. Pupils fixed. MD called into room, rapid response called. 2 doses of narcan given. Will continue to monitor.

## 2016-03-19 NOTE — Progress Notes (Signed)
CRITICAL VALUE ALERT  Critical value received:  Lactic acid 2.2  Date of notification:  03/19/16  Time of notification:  12:11pm  Critical value read back: yes  Nurse who received alert:  Clarise Cruz  MD notified (1st page):  Samtani  Time of first page:  12:15pm  MD notified (2nd page):  Time of second page:  Responding MD:  Verlon Au  Time MD responded:  12:15pm

## 2016-03-19 NOTE — Progress Notes (Signed)
Called in to assist in patient care. Patient unresponsive. Pupils fixed. raipd response called and came up to see patient. MD on floor and into see patient. Narcan given by Lucina Mellow. Will monitor.

## 2016-03-19 NOTE — Progress Notes (Signed)
Pharmacy Antibiotic Note  Lori Schwartz is a 82 y.o. female admitted on 03/11/2016 with pneumonia.  Pharmacy has been consulted for Vancomycin and Zosyn dosing.  Plan: Zosyn 3.375 grams iv Q 8 hours - 4 hr infusion Vancomycin 750 mg iv Q 24 hours Follow up cultures, progress, vancomycin trough if needed (goal of 15 to 20 mcg / dL)  Height: 5\' 4"  (162.6 cm) Weight: 134 lb 7.7 oz (61 kg) IBW/kg (Calculated) : 54.7  Temp (24hrs), Avg:98.5 F (36.9 C), Min:97.6 F (36.4 C), Max:99.1 F (37.3 C)   Recent Labs Lab 02/25/2016 1129 02/20/2016 1227 02/26/2016 1254 03/19/16 0320  WBC 15.7*  --   --  23.4*  CREATININE  --  0.64  --  0.64  LATICACIDVEN  --   --  1.71  --     Estimated Creatinine Clearance: 42 mL/min (by C-G formula based on Cr of 0.64).    Allergies  Allergen Reactions  . Codeine Nausea And Vomiting  . Statins     Muscle pain   . Latex Itching and Rash    Antimicrobials this admission: Zithromax 3/30> Rocephin 3/30> Tamiflu 3/30>   Dose adjustments this admission: Pending  Microbiology results: Flu + Cultures pending  Thank you for allowing pharmacy to be a part of this patient's care.  Tad Moore 03/19/2016 10:42 AM

## 2016-03-19 NOTE — Progress Notes (Signed)
Pt is on/off sleep, restless throughout the night, was coughing on/off, one time order Tussionex provided, 1 dose of ultram and 1 dose of IV morphine provided for the comfort, IV drip continue @75cc , will continue to monitor the patient

## 2016-03-19 NOTE — Consult Note (Signed)
Consultation Note Date: 03/19/2016   Patient Name: Lori Schwartz  DOB: 03-06-1925  MRN: [id number]  Age / Sex: 82 y.o., female  PCP: Lajean Manes, MD Referring Physician: No att. providers found  Reason for Consultation: Terminal Care    Clinical Assessment/Narrative: 82 yo F with Htn, IDDM, MI admitted with H flu, encephalopathy, a-fib, and subsequent development of respiratory failure.  Her family has decided to focus care on her comfort, and palliative consulted for end of life care.  I met with patient and her family.  During initial encounter, patient with no meaningful communication and appeared in acute distress.  I discussed care plan with family including fact that she no longer has IV access per family request.  We talked about potential need to reestablish parenteral route if symptoms cannot be controlled with oral/suppository regimen.  On follow-up, patient resting comfortably.  Will continue current regimen.  Provided anticipatory guidance to family (terminal delirium, excess secretions, apena, etc.)  SUMMARY OF RECOMMENDATIONS - Plan for comfort care moving forward.   - Patient with acute distress on initial exam.  On re-exam following roxanol, she was sleeping comfortably with no signs of distress - Continue aggressive symptom management. - Patient is actively dying.  This will be a terminal admission.  Code Status/Advance Care Planning: DNR    Code Status Orders        Start     Ordered   03/19/16 1746  Do not attempt resuscitation (DNR)   Continuous    Question Answer Comment  In the event of cardiac or respiratory ARREST Do not call a "code blue"   In the event of cardiac or respiratory ARREST Do not perform Intubation, CPR, defibrillation or ACLS   In the event of cardiac or respiratory ARREST Use medication by any route, position, wound care, and other measures to relive pain and  suffering. May use oxygen, suction and manual treatment of airway obstruction as needed for comfort.      03/19/16 1751    Code Status History    Date Active Date Inactive Code Status Order ID Comments User Context   03/19/2016  1:40 PM 03/19/2016  5:51 PM DNR [id number]  Nita Sells, MD Inpatient   03/19/2016  1:39 PM 03/19/2016  1:40 PM DNR [id number]  Nita Sells, MD Inpatient   03/13/2016  5:35 PM 03/19/2016  1:39 PM Partial Code [id number]  Willia Craze, NP ED   05/18/2015  5:51 PM 05/24/2015  6:08 PM Full Code [id number]  Hosie Poisson, MD Inpatient    Advance Directive Documentation        Most Recent Value   Type of Advance Directive  Healthcare Power of [place], Living will   Pre-existing out of facility DNR order (yellow form or pink MOST form)     "MOST" Form in Place?       Symptom Management:  Ms. Dawn does not have IV access per request of her family.  I discussed this with family and we will try to manage symptoms via oral route.  If this is not possible, I discussed possibility of needed to reestablish IV access vs utilization of subcutaneous route.  Pain: Continue roxanol as needed  Agitation: I believe this is most likely terminal agitation/delirium.  Added haldol for use as needed. I think this may be more effective for her current symptoms than ativan. I discussed this with bedside nurse.  Anxiety: ativan as needed  Excess secretions: atropine as needed  Additional Recommendations (Limitations, Scope,  Preferences):  Full Comfort Care  Psycho-social/Spiritual:  Support System: Strong Desire for further Chaplaincy support:Did not address Additional Recommendations: Grief/Bereavement Support  Prognosis: Hours - Days  Discharge Planning: Anticipated Hospital Death   Chief Complaint/ Primary Diagnoses: Present on Admission:  . CAP (community acquired pneumonia) . Acute respiratory failure with hypoxia ([place]) . Hypertension  I have reviewed the  medical record, interviewed the patient and family, and examined the patient. The following aspects are pertinent.  Past Medical History  Diagnosis Date  . Diabetes mellitus without complication ([place])   . Hypertension   . Thyroid disease   . Cancer ([place])     breast  . Acute MI ([place]) 2010    2nd Takotsubo CM  . Hyperlipidemia   . Arthritis   . GERD (gastroesophageal reflux disease)   . Takotsubo cardiomyopathy 2010    no sig CAD at cath, EF 40%   Social History   Social History  . Marital Status: Widowed    Spouse Name: N/A  . Number of Children: N/A  . Years of Education: N/A   Occupational History  . Retired    Social History Main Topics  . Smoking status: Never Smoker   . Smokeless tobacco: None  . Alcohol Use: No  . Drug Use: No  . Sexual Activity: No   Other Topics Concern  . None   Social History Narrative   Lives with son and dtr-in-law. Also has a daughter in the area.   No family history on file. Scheduled Meds: . OLANZapine zydis  5 mg Oral Daily   Continuous Infusions:  PRN Meds:.acetaminophen **OR** acetaminophen, antiseptic oral rinse, atropine, glycopyrrolate, glycopyrrolate, haloperidol **OR** haloperidol **OR** haloperidol lactate, LORazepam **OR** LORazepam **OR** LORazepam, morphine injection, morphine CONCENTRATE, senna Medications Prior to Admission:  Prior to Admission medications   Medication Sig Start Date End Date Taking? Authorizing Provider  acetaminophen (TYLENOL) 325 MG tablet Take 650 mg by mouth every 6 (six) hours as needed for moderate pain or headache.   Yes Historical Provider, MD  amLODipine (NORVASC) 5 MG tablet Take 5 mg by mouth daily.   Yes Historical Provider, MD  aspirin EC 81 MG tablet Take 81 mg by mouth daily.   Yes Historical Provider, MD  calcium-vitamin D (OSCAL WITH D) 500-200 MG-UNIT per tablet Take 1 tablet by mouth daily.   Yes Historical Provider, MD  gabapentin (NEURONTIN) 100 MG capsule Take 100 mg by mouth at  bedtime.   Yes Historical Provider, MD  glipiZIDE (GLUCOTROL XL) 10 MG 24 hr tablet Take 20 mg by mouth daily.   Yes Historical Provider, MD  hydrochlorothiazide (MICROZIDE) 12.5 MG capsule Take 12.5 mg by mouth daily.   Yes Historical Provider, MD  insulin detemir (LEVEMIR) 100 UNIT/ML injection Inject 4 Units into the skin at bedtime.    Yes Historical Provider, MD  levothyroxine (SYNTHROID, LEVOTHROID) 112 MCG tablet Take 112 mcg by mouth daily.   Yes Historical Provider, MD  linagliptin (TRADJENTA) 5 MG TABS tablet Take 5 mg by mouth daily.   Yes Historical Provider, MD  metoprolol succinate (TOPROL-XL) 50 MG 24 hr tablet Take 50 mg by mouth 2 (two) times daily. Take with or immediately following a meal.   Yes Historical Provider, MD  Multiple Vitamin (MULTIVITAMIN WITH MINERALS) TABS tablet Take 1 tablet by mouth daily.   Yes Historical Provider, MD  omeprazole (PRILOSEC) 20 MG capsule Take 20 mg by mouth daily.   Yes Historical Provider, MD  senna (SENOKOT) 8.6 MG  tablet Take 1 tablet by mouth daily as needed for constipation.   Yes Historical Provider, MD  sodium chloride (OCEAN) 0.65 % SOLN nasal spray Place 1 spray into both nostrils 3 (three) times daily.   Yes Historical Provider, MD  traMADol (ULTRAM) 50 MG tablet Take 50 mg by mouth every 4 (four) hours as needed for moderate pain.    Yes Historical Provider, MD  trandolapril (MAVIK) 4 MG tablet Take 8 mg by mouth daily.   Yes Historical Provider, MD  amoxicillin-clavulanate (AUGMENTIN) 875-125 MG per tablet Take 1 tablet by mouth 2 (two) times daily. 05/24/15   Janece Canterbury, MD   Allergies  Allergen Reactions  . Codeine Nausea And Vomiting  . Statins     Muscle pain   . Latex Itching and Rash    Review of Systems  Unable to obtain  Physical Exam  General: Alert but not communicative, Frail, ill appearing female, restless and in acute distress.  HEENT: No bruits, no goiter, no JVD Heart: Regular rate and rhythm. No murmur  appreciated. Lungs: Fair air movement, scattered rales Abdomen: Soft, nontender, nondistended, positive bowel sounds.  Ext: No significant edema Skin: Warm and dry  Vital Signs: BP 136/94 mmHg  Pulse 101  Temp(Src) 98.2 F (36.8 C) (Oral)  Resp 21  Ht _0  (1.626 m)  Wt 61 kg (134 lb 7.7 oz)  BMI 23.07 kg/m2  SpO2 90%  SpO2: SpO2: 90 % O2 Device:SpO2: 90 % O2 Flow Rate: .O2 Flow Rate (L/min): 5 L/min  IO: Intake/output summary:  Intake/Output Summary (Last 24 hours) at 03/19/16 2228 Last data filed at 03/19/16 0300  Gross per 24 hour  Intake     50 ml  Output    450 ml  Net   -400 ml    LBM: Last BM Date: 03/16/16 Baseline Weight: Weight: 59.875 kg (132 lb) Most recent weight: Weight: 61 kg (134 lb 7.7 oz)      Palliative Assessment/Data:  Flowsheet Rows        Most Recent Value   Intake Tab    Referral Department  Hospitalist   Unit at Time of Referral  Cardiac/Telemetry Unit   Palliative Care Primary Diagnosis  Sepsis/Infectious Disease   Date Notified  03/19/16   Palliative Care Type  New Palliative care   Reason for referral  End of Life Care Assistance   Date of Admission  02/20/2016   Date first seen by Palliative Care  03/19/16   # of days Palliative referral response time  0 Day(s)   # of days IP prior to Palliative referral  1   Clinical Assessment    Palliative Performance Scale Score  10%   Pain Max last 24 hours  Not able to report   Pain Min Last 24 hours  Not able to report   Psychosocial & Spiritual Assessment    Palliative Care Outcomes    Patient/Family meeting held?  Yes   Who was at the meeting?  Family, including children   Palliative Care Outcomes  Improved pain interventions, Improved non-pain symptom therapy, Provided end of life care assistance      Additional Data Reviewed:  CBC:    Component Value Date/Time   WBC 23.4* 03/19/2016 0320   HGB 14.5 03/19/2016 0320   HCT 42.5 03/19/2016 0320   PLT 165 03/19/2016 0320   MCV 88.2  03/19/2016 0320   NEUTROABS 11.7* 05/31/2010 0420   LYMPHSABS 3.2 05/31/2010 0420   MONOABS 1.4* 05/31/2010  0420   EOSABS 0.0 05/31/2010 0420   BASOSABS 0.0 05/31/2010 0420   Comprehensive Metabolic Panel:    Component Value Date/Time   NA 123* 03/19/2016 0320   K 3.7 03/19/2016 0320   CL 82* 03/19/2016 0320   CO2 30 03/19/2016 0320   BUN 10 03/19/2016 0320   CREATININE 0.64 03/19/2016 0320   GLUCOSE 353* 03/19/2016 0320   CALCIUM 8.6* 03/19/2016 0320   AST 23 05/31/2010 0105   ALT 11 05/31/2010 0105   ALKPHOS 28* 05/31/2010 0105   BILITOT 0.6 05/31/2010 0105   PROT 6.4 05/31/2010 0105   ALBUMIN 3.3* 05/31/2010 0105   Time In: 1640 Time Out: 1800 Time Total: 80 Greater than 50%  of this time was spent counseling and coordinating care related to the above assessment and plan.  Signed by: Micheline Rough, MD  Micheline Rough, MD  03/19/2016, 10:28 PM  Please contact Palliative Medicine Team phone at [phone] for questions and concerns.

## 2016-03-19 NOTE — Progress Notes (Addendum)
Lori Schwartz [id number] DOB: 09-17-1925 DOA: 03/04/2016 PCP: Mathews Argyle, MD  Brief narrative:  82 y/o ?  HTN IDDM H/o aspiration pneumonia 05/2015 hospitalized for the same-dysphagia noted at the time and requested to continue regular diet with thin liquids Cardiac cath 06/2001 = normal at the time. 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 Note that patient does not have prior oxygen requirements.  On admission her EKG showed A. fib with LBBB Sodium 128 down from a baseline of 140, troponin 0.05 with a flat trend, WBC was 15    Past medical history-As per Problem list Chart reviewed as belowreviewed F2 reviewed  Consultants:    Procedures:    Antibiotics:  Ceftriaxone 3/29  Azithromycin 3/29   Tamiflu 3/30   Subjective   Patient unresponsive this a.m. despite sternal rub as well as stimulation by RN Rapid response called Narcan 0.4X2 given with some improvement in mental state On day of admission 3/29 was more conversant and coherent etc. Subsequent to rapid response and nursing evaluation and management patient is a little bit more oriented She still not completely verbal She's moving all 4 limbs equally and response to minutes, GCS = ~11   Objective    Interim History:   TelemArtifact with PVCs   Objective: Filed Vitals:   02/24/2016 1730 02/22/2016 1800 02/28/2016 1900 03/19/16 0526  BP:   147/88 90/60  Pulse: 76 87 89   Temp:   98.9 F (37.2 C) 98.2 F (36.8 C)  TempSrc:   Oral Oral  Resp:  19 18   Height:   5\' 4"  (1.626 m)   Weight:   60.056 kg (132 lb 6.4 oz) 61 kg (134 lb 7.7 oz)  SpO2: 95%  98% 93%    Intake/Output Summary (Last 24 hours) at 03/19/16 0947 Last data filed at 03/19/16 0300  Gross per 24 hour  Intake     50 ml  Output    450 ml    Net   -400 ml    Exam:  GenerArousable to noxious stimuli but nonconversant. CardiS1-S2 irregular RespiCrackles posterolaterally bilaterally AbdomSoft nontender nondistended no rebound SkinNo lower extremity edema Neuo encephalopathic at present time not completely following commands, GCS about 11   Data Reviewed: Basic Metabolic Panel:  Recent Labs Lab 02/25/2016 1129 03/11/2016 1227 03/19/16 0320  NA  --  128* 123*  K  --  2.5* 3.7  CL  --  82* 82*  CO2  --  34* 30  GLUCOSE  --  286* 353*  BUN  --  6 10  CREATININE  --  0.64 0.64  CALCIUM  --  8.8* 8.6*  MG 2.1  --   --    Liver Function Tests: No results for input(s): AST, ALT, ALKPHOS, BILITOT, PROT, ALBUMIN in the last 168 hours. No results for input(s): LIPASE, AMYLASE in the last 168 hours. No results for input(s): AMMONIA in the last 168 hours. CBC:  Recent Labs Lab 03/11/2016 1129 03/19/16 0320  WBC 15.7* 23.4*  HGB 15.7* 14.5  HCT 43.1 42.5  MCV 85.5 88.2  PLT 186 165   Cardiac Enzymes:  Recent Labs Lab 02/25/2016 1227 03/02/2016 1558 03/08/2016 2140 03/19/16 0320  TROPONINI 0.04* 0.06* 0.05* 0.04*   BNP: Invalid input(s): POCBNP CBG:  Recent Labs Lab 03/19/2016 1129 03/19/16  [place]    No results found for this or any previous visit (from the past 240 hour(s)).   Studies:              All Imaging reviewed and is as per above notation   Scheduled Meds: . amLODipine  5 mg Oral Daily  . aspirin EC  81 mg Oral Daily  . azithromycin  500 mg Intravenous Q24H  . cefTRIAXone (ROCEPHIN)  IV  1 g Intravenous Q24H  . enoxaparin (LOVENOX) injection  1 mg/kg Subcutaneous Q12H  . gabapentin  100 mg Oral QHS  . insulin aspart  0-9 Units Subcutaneous TID WC  . insulin glargine  5 Units Subcutaneous Daily  . levothyroxine  112 mcg Oral Daily  . metoprolol succinate  50 mg Oral BID  . naloxone      . pantoprazole  40 mg Oral Daily  . sodium chloride  1 spray Each Nare TID  . sodium  chloride flush  3 mL Intravenous Q12H  . trandolapril  8 mg Oral Daily   Continuous Infusions: . sodium chloride 75 mL/hr at 02/19/2016 1817     Assessment/Plan:  Toxic metabolic encephalopathy-unclear etiology-DDX = worsening pneumonia given the rise in white count from 15-23, PE, cardiac issue-more likely than not this is worsening of her pneumonia with decrease in respiration and CO2 narcosis in my opinion. I'm awaiting a blood gas at this time. I have discussed with the daughter on the telephone who states patient is a partial code-okay for BiPAP, okay for medications but no aggressive CPR.  H influenza B positive-unclear if there is any specific indication at this stage for Tamiflu-evidences varied with regards to its efficacy when the flu is established. For now we will treat if she is awake enough with Tamiflu 30 twice a day. May consider transitioning from ceftriaxone and azithromycin to Vanco and Zosyn for sepsis-  Hypovolemic hyponatremia-probably secondary to sepsis picture-she has dropped from baseline of 140 sodium to a sodium 123 I think she needs volume and we will give her fluids at 1 50 cc an hour for now, obtain lactic acid and cycle, and monitor--- it is difficult to determine whether she actually has septic shock given she is on 3 blood pressure medications including metoprolol, Mavik, amlodipine. I have held her amlodipine and Mavik for now and she will continue on a lower dose of metoprolol 25 twice a day XL-usually the half-life this medication is 18 hours so that is a reasonable adjustment.  Metabolic encephalopathy-see above discussion there is a component of probable polypharmacy-patient did receive morphine injection as well as tramadol as well as IV Compazine which was given for nausea initially Compazine on the p.m. 3/29 through 3/30 and we will delineate these medications accordingly. Discontinue gabapentin daily at bedtime 100, discontinue tramadol 50 every 6 when  necessary  Hypothyroidism-continue Synthroid 112 g if able when she wakes up later today we will reschedule the medication for 6 PM-if not we will give a dose of 66 g IV   Chronic atrial fibrillation, [place] score 4-appreciate cardiology input. Note changes to beta blockade-for now continue full dose Lovenox  10 minute conversation with daughter on telephone this morning explaining change in status- Transfer to stepdown unit-partial code, no CPR but okay for meds, BiPAP, fluids.  We will reassess patient this afternoon     CRITICAL CARE Performed by: Nita Sells   Total critical care time: 45 min, greater than half was direct patient  care minutes  Critical care time was exclusive of separately billable procedures and treating other patients.  Critical care was necessary to treat or prevent imminent or life-threatening deterioration.  Critical care was time spent personally by me on the following activities: development of treatment plan with patient and/or surrogate as well as nursing, discussions with consultants, evaluation of patient's response to treatment, examination of patient, obtaining history from patient or surrogate, ordering and performing treatments and interventions, ordering and review of laboratory studies, ordering and review of radiographic studies, pulse oximetry and re-evaluation of patient's condition.  Verneita Griffes, MD  [facility] Pager [phone] 03/19/2016, 9:47 AM    LOS: 1 day

## 2016-03-19 NOTE — Progress Notes (Signed)
Nutrition Brief Note  Pt identified by MST.   Spoke with pt's RN . RN reports that pt is with comfort care. RN reports pt is non-responsive and not appropriate for nutrition interventions.   No nutrition interventions warrant at this time. If nutritional needs arise please consult RD.   Raford Pitcher, Dietetic Intern Pager: [phone]

## 2016-03-19 NOTE — Progress Notes (Signed)
Family refused ECHO. Will make MD aware. Will continue to monitor.

## 2016-03-19 NOTE — Progress Notes (Signed)
Pt is running high blood sugar, this am 331, coverage was scheduled from this morning 8am, but 7 units insulin provided at 6.55am based on the blood sugar this morning.

## 2016-03-20 DIAGNOSIS — Z66 Do not resuscitate: Secondary | ICD-10-CM | POA: Diagnosis present

## 2016-03-20 DIAGNOSIS — I447 Left bundle-branch block, unspecified: Secondary | ICD-10-CM | POA: Diagnosis present

## 2016-03-20 DIAGNOSIS — K219 Gastro-esophageal reflux disease without esophagitis: Secondary | ICD-10-CM | POA: Diagnosis present

## 2016-03-20 DIAGNOSIS — R778 Other specified abnormalities of plasma proteins: Secondary | ICD-10-CM | POA: Diagnosis present

## 2016-03-20 DIAGNOSIS — M1990 Unspecified osteoarthritis, unspecified site: Secondary | ICD-10-CM | POA: Diagnosis present

## 2016-03-20 DIAGNOSIS — J1000 Influenza due to other identified influenza virus with unspecified type of pneumonia: Principal | ICD-10-CM | POA: Diagnosis present

## 2016-03-20 DIAGNOSIS — J9601 Acute respiratory failure with hypoxia: Secondary | ICD-10-CM | POA: Diagnosis present

## 2016-03-20 DIAGNOSIS — Z9012 Acquired absence of left breast and nipple: Secondary | ICD-10-CM

## 2016-03-20 DIAGNOSIS — Z853 Personal history of malignant neoplasm of breast: Secondary | ICD-10-CM

## 2016-03-20 DIAGNOSIS — I252 Old myocardial infarction: Secondary | ICD-10-CM

## 2016-03-20 DIAGNOSIS — E876 Hypokalemia: Secondary | ICD-10-CM | POA: Diagnosis present

## 2016-03-20 DIAGNOSIS — I482 Chronic atrial fibrillation: Secondary | ICD-10-CM | POA: Diagnosis present

## 2016-03-20 DIAGNOSIS — Z91040 Latex allergy status: Secondary | ICD-10-CM

## 2016-03-20 DIAGNOSIS — R1310 Dysphagia, unspecified: Secondary | ICD-10-CM | POA: Diagnosis present

## 2016-03-20 DIAGNOSIS — E871 Hypo-osmolality and hyponatremia: Secondary | ICD-10-CM | POA: Diagnosis present

## 2016-03-20 DIAGNOSIS — E119 Type 2 diabetes mellitus without complications: Secondary | ICD-10-CM | POA: Diagnosis present

## 2016-03-20 DIAGNOSIS — E860 Dehydration: Secondary | ICD-10-CM | POA: Diagnosis present

## 2016-03-20 DIAGNOSIS — Z7982 Long term (current) use of aspirin: Secondary | ICD-10-CM

## 2016-03-20 DIAGNOSIS — Z885 Allergy status to narcotic agent status: Secondary | ICD-10-CM

## 2016-03-20 DIAGNOSIS — Z515 Encounter for palliative care: Secondary | ICD-10-CM | POA: Diagnosis present

## 2016-03-20 DIAGNOSIS — Z79899 Other long term (current) drug therapy: Secondary | ICD-10-CM

## 2016-03-20 DIAGNOSIS — I5020 Unspecified systolic (congestive) heart failure: Secondary | ICD-10-CM | POA: Diagnosis present

## 2016-03-20 DIAGNOSIS — I071 Rheumatic tricuspid insufficiency: Secondary | ICD-10-CM | POA: Diagnosis present

## 2016-03-20 DIAGNOSIS — E039 Hypothyroidism, unspecified: Secondary | ICD-10-CM | POA: Diagnosis present

## 2016-03-20 DIAGNOSIS — Z794 Long term (current) use of insulin: Secondary | ICD-10-CM

## 2016-03-20 DIAGNOSIS — G9341 Metabolic encephalopathy: Secondary | ICD-10-CM | POA: Diagnosis present

## 2016-03-20 DIAGNOSIS — I110 Hypertensive heart disease with heart failure: Secondary | ICD-10-CM | POA: Diagnosis present

## 2016-03-20 DIAGNOSIS — E079 Disorder of thyroid, unspecified: Secondary | ICD-10-CM | POA: Diagnosis present

## 2016-03-20 DIAGNOSIS — E785 Hyperlipidemia, unspecified: Secondary | ICD-10-CM | POA: Diagnosis present

## 2016-03-20 LAB — HEMOGLOBIN A1C
HEMOGLOBIN A1C: 7.5 % — AB (ref 4.8–5.6)
MEAN PLASMA GLUCOSE: 169 mg/dL

## 2016-03-20 MED ORDER — ATROPINE SULFATE 1 % OP SOLN
4.0000 [drp] | Freq: Three times a day (TID) | OPHTHALMIC | Status: DC
  Filled 2016-03-20: qty 2

## 2016-03-20 MED ORDER — HALOPERIDOL LACTATE 2 MG/ML PO CONC
1.0000 mg | Freq: Two times a day (BID) | ORAL | Status: DC
  Administered 2016-03-20: 1 mg via ORAL
  Filled 2016-03-20 (×2): qty 0.5

## 2016-03-21 NOTE — Discharge Summary (Signed)
Death Summary  Lori Schwartz [id number] DOB: 1925/01/22 DOA: 2016/04/14  PCP: Mathews Argyle, MD PCP/Office notified: No  Admit date: April 14, 2016 Date of Death: 04-16-16  Final Diagnoses:  Active Problems:   CAP (community acquired pneumonia)   Acute respiratory failure with hypoxia ([place])   Diabetes mellitus without complication ([facility])   Hypertension   Leukocytosis   Atrial fibrillation ([facility])   Elevated troponin   Prolonged Q-T interval on ECG   Palliative care encounter   82 y/o ? HTN IDDM H/o aspiration pneumonia 05/2015 hospitalized for the same-dysphagia noted at the time and requested to continue regular diet with thin liquids Cardiac cath 06/2001 = normal at the time. 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 Note that patient does not have prior oxygen requirements.  On admission her EKG showed A. fib with LBBB Sodium 128 down from a baseline of 140, troponin 0.05 with a flat trend, WBC was 15   I assessed her initially on the morning after admission 3/30 and the patient was found to be somewhat obtunded hypoxic and lethargic-it was felt that she had severe metabolic encephalopathy from severe respiratory acidosis from her pneumonia and H influenza B We attempted noninvasive measures to support her clinically deteriorating condition but family elected to pursue comfort measures as patient would not have wanted intubation nor aggressive CPR and resuscitation. Patient was placed on comfort measures on 3/30 p.m. after detailed discussion with family members palliative care was consulted and ultimately patient expired 04-16-16 at 12:25 PM and I confirmed patient's death  With lack of chest redness, dilated fixed pupils and no heartbeat   Time: 35  Signed:  Nita Sells  [facility] Apr 16, 2016, 3:32 PM

## 2016-03-21 NOTE — Progress Notes (Signed)
Daily Progress Note   Patient Name: Lori Schwartz       Date: 2016/04/10 DOB: [birth year]  Age: 82 y.o. MRN#: [id number] Attending Physician: Nita Sells, MD Primary Care Physician: Mathews Argyle, MD Admit Date: 03/13/2016  Reason for Consultation/Follow-up: Non pain symptom management, Pain control, Psychosocial/spiritual support and Terminal Care  Subjective: Patient has had periods of time of EKG agitation especially when she's had a lot of visitors. She has utilized to when necessary doses of Haldol, 1 when necessary atropine, as well as morphine. She opened her eyes when I tried to talk to her that began to look anxious and tearful. Her daughter is at the bedside and states this is what they've been seen now when she was awake. Patient is full comfort care at this point. Introduced the concept of hospice inpatient facility if she is to stabilize and in the interim of moving to [address]. Candace, primary healthcare POA, is at the bedside as well as her brother, and have elected to move to [address]. I did share that concerned that for people that are nearing death that are having trouble speaking for themselves that by the time we see agitation shortness of breath they could've been struggling with this for a while and recommended scheduling medications. Daughter was in agreement with this particularly regarding agitation, Haldol. Interval Events: Patient is transitioning Length of Stay: 2 days  Current Medications: Scheduled Meds:  . atropine  4 drop Sublingual 3 times per day  . haloperidol  1 mg Oral BID    Continuous Infusions:    PRN Meds: acetaminophen **OR** acetaminophen, antiseptic oral rinse, atropine, glycopyrrolate, glycopyrrolate, haloperidol **OR** haloperidol **OR**  haloperidol lactate, LORazepam **OR** LORazepam **OR** LORazepam, morphine injection, morphine CONCENTRATE  Physical Exam: Physical Exam  Constitutional:  Frail elderly female who appears acutely ill  HENT:  Head: Normocephalic.  Cardiovascular:  Irregular  Pulmonary/Chest:  Increased work of breathing at rest  Neurological:  Opens her eyes to voice but is nonverbal. When awake is exhibiting pattern of agitation  Skin:  Warm, no mottling                Vital Signs: BP 106/48 mmHg  Pulse 89  Temp(Src) 98.2 F (36.8 C) (Oral)  Resp 21  Ht 5\' 4"  (1.626 m)  Wt 61  kg (134 lb 7.7 oz)  BMI 23.07 kg/m2  SpO2 92% SpO2: SpO2: 92 % O2 Device: O2 Device: Nasal Cannula O2 Flow Rate: O2 Flow Rate (L/min): 5 L/min  Intake/output summary:  Intake/Output Summary (Last 24 hours) at 22-Mar-2016 1202 Last data filed at 03-22-2016 0600  Gross per 24 hour  Intake      0 ml  Output      0 ml  Net      0 ml   LBM: Last BM Date: 03/16/16 Baseline Weight: Weight: 59.875 kg (132 lb) Most recent weight: Weight: 61 kg (134 lb 7.7 oz)       Palliative Assessment/Data: Flowsheet Rows        Most Recent Value   Intake Tab    Referral Department  Hospitalist   Unit at Time of Referral  Cardiac/Telemetry Unit   Palliative Care Primary Diagnosis  Sepsis/Infectious Disease   Date Notified  03/19/16   Palliative Care Type  New Palliative care   Reason for referral  End of Life Care Assistance   Date of Admission  02/20/2016   Date first seen by Palliative Care  03/19/16   # of days Palliative referral response time  0 Day(s)   # of days IP prior to Palliative referral  1   Clinical Assessment    Palliative Performance Scale Score  10%   Pain Max last 24 hours  Not able to report   Pain Min Last 24 hours  Not able to report   Psychosocial & Spiritual Assessment    Palliative Care Outcomes    Patient/Family meeting held?  Yes   Who was at the meeting?  Family, including children   Palliative Care  Outcomes  Improved pain interventions, Improved non-pain symptom therapy, Provided end of life care assistance      Additional Data Reviewed: CBC    Component Value Date/Time   WBC 23.4* 03/19/2016 0320   RBC 4.82 03/19/2016 0320   HGB 14.5 03/19/2016 0320   HCT 42.5 03/19/2016 0320   PLT 165 03/19/2016 0320   MCV 88.2 03/19/2016 0320   MCH 30.1 03/19/2016 0320   MCHC 34.1 03/19/2016 0320   RDW 13.3 03/19/2016 0320   LYMPHSABS 3.2 05/31/2010 0420   MONOABS 1.4* 05/31/2010 0420   EOSABS 0.0 05/31/2010 0420   BASOSABS 0.0 05/31/2010 0420    CMP     Component Value Date/Time   NA 123* 03/19/2016 0320   K 3.7 03/19/2016 0320   CL 82* 03/19/2016 0320   CO2 30 03/19/2016 0320   GLUCOSE 353* 03/19/2016 0320   BUN 10 03/19/2016 0320   CREATININE 0.64 03/19/2016 0320   CALCIUM 8.6* 03/19/2016 0320   PROT 6.4 05/31/2010 0105   ALBUMIN 3.3* 05/31/2010 0105   AST 23 05/31/2010 0105   ALT 11 05/31/2010 0105   ALKPHOS 28* 05/31/2010 0105   BILITOT 0.6 05/31/2010 0105   GFRNONAA >60 03/19/2016 0320   GFRAA >60 03/19/2016 0320       Problem List:  Patient Active Problem List   Diagnosis Date Noted  . Leukocytosis 03/16/2016  . Atrial fibrillation ([place]) 03/16/2016  . Elevated troponin 03/17/2016  . Prolonged Q-T interval on ECG 03/06/2016  . Acute respiratory failure with hypoxia ([place]) 05/22/2015  . Diabetes mellitus without complication ([place])   . Hypertension   . Pneumonia 05/18/2015  . CAP (community acquired pneumonia) 05/18/2015     Palliative Care Assessment & Plan    1.Code Status:  DNR  Code Status Orders        Start     Ordered   03/19/16 1746  Do not attempt resuscitation (DNR)   Continuous    Question Answer Comment  In the event of cardiac or respiratory ARREST Do not call a "code blue"   In the event of cardiac or respiratory ARREST Do not perform Intubation, CPR, defibrillation or ACLS   In the event of cardiac or respiratory ARREST Use  medication by any route, position, wound care, and other measures to relive pain and suffering. May use oxygen, suction and manual treatment of airway obstruction as needed for comfort.      03/19/16 1751    Code Status History    Date Active Date Inactive Code Status Order ID Comments User Context   03/19/2016  1:40 PM 03/19/2016  5:51 PM DNR [id number]  Nita Sells, MD Inpatient   03/19/2016  1:39 PM 03/19/2016  1:40 PM DNR [id number]  Nita Sells, MD Inpatient   03/08/2016  5:35 PM 03/19/2016  1:39 PM Partial Code [id number]  Willia Craze, NP ED   05/18/2015  5:51 PM 05/24/2015  6:08 PM Full Code [id number]  Hosie Poisson, MD Inpatient    Advance Directive Documentation        Most Recent Value   Type of Advance Directive  Healthcare Power of [place], Living will   Pre-existing out of facility DNR order (yellow form or pink MOST form)     "MOST" Form in Place?         2. Goals of Care/Additional Recommendations:  Full comfort care  Family anticipate in hospital death, but did introduce the idea of hospice inpatient should she stabilize. Daughter to discuss this further with her siblings  Limitations on Scope of Treatment: Full Comfort Care  Desire for further Chaplaincy support:no  Psycho-social Needs: Grief/Bereavement Support  3. Symptom Management:      1. Pain: Patient no longer has an IV site, ordered a morphine, concentrated solution, as well as subcutaneous morphine when necessary. Monitor for need for scheduled dosing      2. Terminal agitation: Start Haldol 1 mg every 12 and continue with every 4 when necessary      3. Secretions: Start scheduled atropine 4 drops every 8 hours on a scheduled basis, and every 4 when necessary  4. Palliative Prophylaxis:   Bowel Regimen, Delirium Protocol, Frequent Pain Assessment, Oral Care and Turn Reposition  5. Prognosis: Hours - Days  6. Discharge Planning:  Hospital death versus inpatient hospice   Thank you for  allowing the Palliative Medicine Team to assist in the care of this patient.   Time In: 0900 Time Out: 0945 Total Time 45 min Prolonged Time Billed  no         Dory Horn, NP  21-Mar-2016, 12:02 PM  Please contact Palliative Medicine Team phone at [phone] for questions and concerns.

## 2016-03-21 NOTE — Progress Notes (Signed)
We will sign off. 

## 2016-03-21 NOTE — Progress Notes (Signed)
Patient family has refused foley at this time. Will continue to monitor.

## 2016-03-21 NOTE — Progress Notes (Signed)
Called daughter, Hal Hope to make her aware of pt passing. Will try to call again.

## 2016-03-21 NOTE — Progress Notes (Signed)
Family member approached RN to have me come check on pt. Once arrived to the room assessed pt, no audible breath sounds or pulses. Hollice Gong, RN verified. [facility] notified.

## 2016-03-21 DEATH — deceased

## 2016-03-23 LAB — CULTURE, BLOOD (ROUTINE X 2)
Culture: NO GROWTH
Culture: NO GROWTH

## 2016-10-02 IMAGING — CR DG CHEST 1V PORT
1 series · 1 of 1 positions shown · non-contrast
Comparison: 05/31/2015

CLINICAL DATA: Shortness of Breath

EXAM:
PORTABLE CHEST 1 VIEW

[AP]
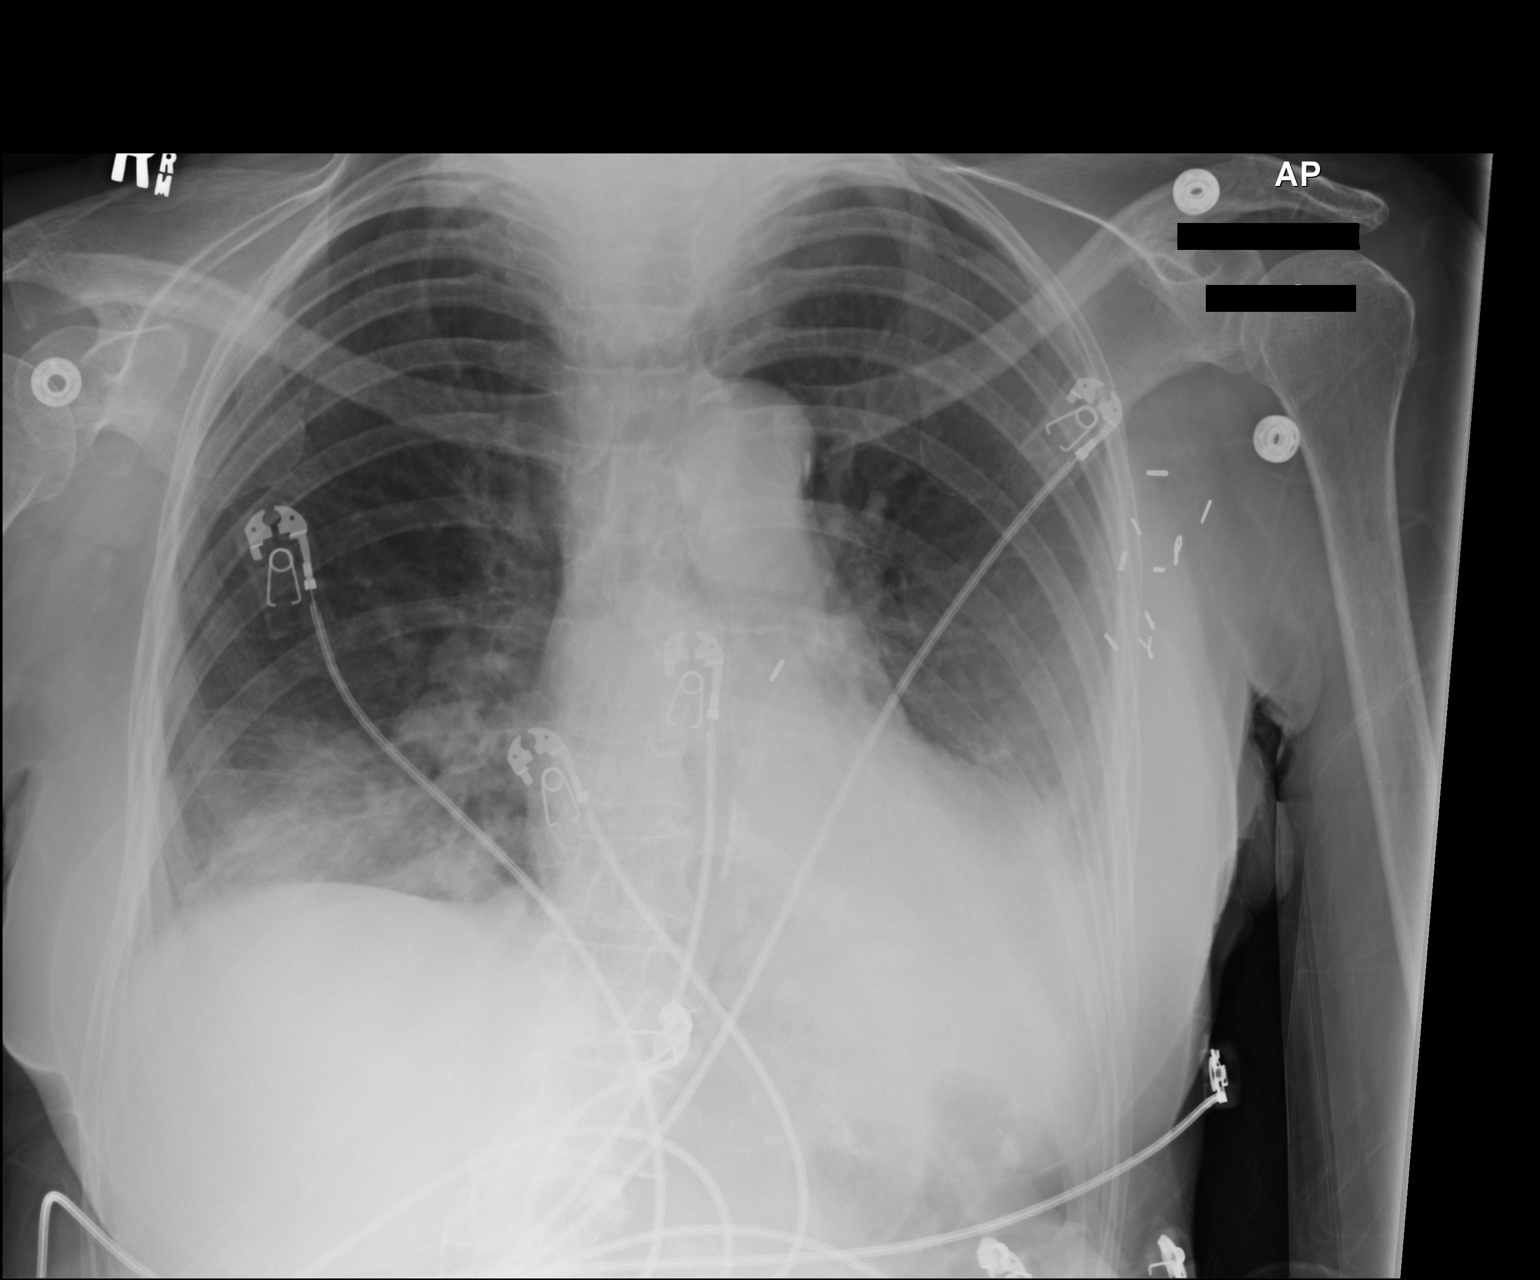

[1 of 1 positions shown; findings below may reference images not displayed]

FINDINGS: Bibasilar airspace opacities are noted, concerning for pneumonia.
Heart is normal size. Surgical clips in the left axilla. No
effusions or acute bony abnormality.
IMPRESSION: Bilateral lower lobe airspace opacities concerning for pneumonia.
